# Patient Record
Sex: Female | Born: 1980 | Race: White | Hispanic: No | Marital: Married | Smoking: Never smoker
Health system: Southern US, Community
[De-identification: ages and names within clinical notes are randomized; demographics above are authoritative.]

## PROBLEM LIST (undated history)

## (undated) ENCOUNTER — Inpatient Hospital Stay (HOSPITAL_COMMUNITY): Payer: Self-pay

## (undated) DIAGNOSIS — D6859 Other primary thrombophilia: Secondary | ICD-10-CM

## (undated) DIAGNOSIS — E88819 Insulin resistance, unspecified: Secondary | ICD-10-CM

## (undated) DIAGNOSIS — D6959 Other secondary thrombocytopenia: Secondary | ICD-10-CM

## (undated) DIAGNOSIS — T7840XA Allergy, unspecified, initial encounter: Secondary | ICD-10-CM

## (undated) DIAGNOSIS — K5900 Constipation, unspecified: Secondary | ICD-10-CM

## (undated) DIAGNOSIS — D689 Coagulation defect, unspecified: Secondary | ICD-10-CM

## (undated) DIAGNOSIS — K589 Irritable bowel syndrome without diarrhea: Secondary | ICD-10-CM

## (undated) DIAGNOSIS — E282 Polycystic ovarian syndrome: Secondary | ICD-10-CM

## (undated) DIAGNOSIS — B019 Varicella without complication: Secondary | ICD-10-CM

## (undated) DIAGNOSIS — R030 Elevated blood-pressure reading, without diagnosis of hypertension: Secondary | ICD-10-CM

## (undated) DIAGNOSIS — O99119 Other diseases of the blood and blood-forming organs and certain disorders involving the immune mechanism complicating pregnancy, unspecified trimester: Secondary | ICD-10-CM

## (undated) DIAGNOSIS — D6852 Prothrombin gene mutation: Secondary | ICD-10-CM

## (undated) DIAGNOSIS — E8881 Metabolic syndrome: Secondary | ICD-10-CM

## (undated) DIAGNOSIS — O139 Gestational [pregnancy-induced] hypertension without significant proteinuria, unspecified trimester: Secondary | ICD-10-CM

## (undated) DIAGNOSIS — D759 Disease of blood and blood-forming organs, unspecified: Secondary | ICD-10-CM

## (undated) HISTORY — DX: Irritable bowel syndrome, unspecified: K58.9

## (undated) HISTORY — DX: Varicella without complication: B01.9

## (undated) HISTORY — DX: Allergy, unspecified, initial encounter: T78.40XA

## (undated) HISTORY — PX: OTHER SURGICAL HISTORY: SHX169

## (undated) HISTORY — DX: Constipation, unspecified: K59.00

## (undated) HISTORY — DX: Prothrombin gene mutation: D68.52

## (undated) HISTORY — DX: Coagulation defect, unspecified: D68.9

---

## 2003-09-26 ENCOUNTER — Other Ambulatory Visit: Admission: RE | Admit: 2003-09-26 | Discharge: 2003-09-26 | Payer: Self-pay | Admitting: Family Medicine

## 2009-08-10 ENCOUNTER — Encounter: Payer: Self-pay | Admitting: Family Medicine

## 2009-08-10 ENCOUNTER — Ambulatory Visit (HOSPITAL_COMMUNITY): Admission: RE | Admit: 2009-08-10 | Discharge: 2009-08-10 | Payer: Self-pay | Admitting: Family Medicine

## 2009-08-10 ENCOUNTER — Ambulatory Visit: Payer: Self-pay | Admitting: Vascular Surgery

## 2011-01-28 ENCOUNTER — Encounter (HOSPITAL_COMMUNITY): Payer: Self-pay

## 2011-01-28 ENCOUNTER — Ambulatory Visit (HOSPITAL_COMMUNITY)
Admission: RE | Admit: 2011-01-28 | Discharge: 2011-01-28 | Disposition: A | Discharge status: Home / Self Care | Payer: BC Managed Care – PPO | Source: Ambulatory Visit | Attending: Obstetrics and Gynecology | Admitting: Obstetrics and Gynecology

## 2011-01-28 DIAGNOSIS — O30049 Twin pregnancy, dichorionic/diamniotic, unspecified trimester: Secondary | ICD-10-CM

## 2011-01-28 NOTE — Progress Notes (Signed)
MFM CONSULT  30 y/o G1 at 10 weeks 6 days with dichorionic twin gestation conceived after IVF.  Patient has a history that has been attributed to her infertility history and is currently taking Metformin 1000mg  BID because of this.   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  These were diagnosed and confirmed by Dr. Mikel Cella with [facility] hematology due to a family history of patient's brother having a childhood stroke.  Patient herself does not have a history of any thromboembolic events.  She is currently treated with Lovenox 40 mg QD for thromboprophylaxsis.     She presents today for recommendations regarding pregnancy management.  These were discussed at length and are outlined below:  1. Recommend prophylactic anticoagulation for the duration of pregnancy and through 6 weeks post partum.  Her current dose of Lovenox is an acceptable regimen.  Recommend increasing this to 40mg  BID at 28 weeks due to patient's underlying pathologies, increased BMI, and twin gestation.  Patient is currently having some issues with her insurance and requests unfractionated heparin for the next several weeks due to the cost association with Lovenox.  A prescription was provided today for Heparin 7500 units BID SQ and teaching was provided regarding use of this.  The risks and benefits of Lovenox versus Heparin were reviewed.  Platelet count should be evaluated within 1 week of changing to unfractionated heparin.    2.   Close surveillance for the development of preterm labor or cervical shortening.  Measurement of cervical length at several points through the mid second to mid third trimester may be of benefit in determining increased risks for premature delivery and allowing interventions such as decreased activity, administration of betamethasone, and intensified surveillance.  3.  Serial evaluations of fetal growth and amniotic fluid volume at approximately 4 week  intervals through the third trimester.  Antenatal testing starting at 32 weeks may be of benefit.  4.  If maternal and fetal status remain reassuring, delivery is recommended at [redacted] weeks gestation.  5.  Given a diagnosis of PCOS, increased BMI, and twin gestation, early screening for gestational diabetes is recommended.  If this is normal, repeat evaluation at 26-25 weeks is recommended.    6.  Close surveillance for the development of preeclampsia will also be necessary.  The signs and symptoms of this were reviewed today.  First trimester screening for fetal aneuploidy was also discussed today.  Patient declined this.  Recommend ultrasound at 18 weeks for evaluation of fetal anatomy.    Thanks for consult.  We would be pleased to perform any of this testing or to consult again at any point in gestation.  If desired, please call to schedule.   40 min

## 2011-02-08 LAB — HEPATITIS B SURFACE ANTIGEN: Hepatitis B Surface Ag: NEGATIVE

## 2011-02-08 LAB — ABO/RH: RH Type: POSITIVE

## 2011-02-08 LAB — RPR: RPR: NONREACTIVE

## 2011-04-19 DIAGNOSIS — O139 Gestational [pregnancy-induced] hypertension without significant proteinuria, unspecified trimester: Secondary | ICD-10-CM

## 2011-04-19 HISTORY — DX: Gestational (pregnancy-induced) hypertension without significant proteinuria, unspecified trimester: O13.9

## 2011-07-19 ENCOUNTER — Encounter (HOSPITAL_COMMUNITY): Payer: Self-pay

## 2011-07-20 ENCOUNTER — Other Ambulatory Visit: Payer: Self-pay | Admitting: Obstetrics & Gynecology

## 2011-08-01 ENCOUNTER — Encounter (HOSPITAL_COMMUNITY)
Admission: RE | Admit: 2011-08-01 | Discharge: 2011-08-01 | Disposition: A | Discharge status: Home / Self Care | Payer: BC Managed Care – PPO | Source: Ambulatory Visit | Attending: Obstetrics & Gynecology | Admitting: Obstetrics & Gynecology

## 2011-08-01 ENCOUNTER — Encounter (HOSPITAL_COMMUNITY): Payer: Self-pay

## 2011-08-01 HISTORY — DX: Metabolic syndrome: E88.81

## 2011-08-01 HISTORY — DX: Disease of blood and blood-forming organs, unspecified: D75.9

## 2011-08-01 HISTORY — DX: Insulin resistance, unspecified: E88.819

## 2011-08-01 HISTORY — DX: Gestational (pregnancy-induced) hypertension without significant proteinuria, unspecified trimester: O13.9

## 2011-08-01 LAB — CBC
MCH: 28.6 pg (ref 26.0–34.0)
MCHC: 33.6 g/dL (ref 30.0–36.0)
MCV: 85.1 fL (ref 78.0–100.0)
Platelets: 162 10*3/uL (ref 150–400)
RBC: 4.62 MIL/uL (ref 3.87–5.11)

## 2011-08-01 LAB — SURGICAL PCR SCREEN: MRSA, PCR: NEGATIVE

## 2011-08-01 NOTE — Patient Instructions (Addendum)
YOUR PROCEDURE IS SCHEDULED ON:08/04/11  ENTER THROUGH THE MAIN ENTRANCE OF [facility] AT:1145 am  USE DESK PHONE AND DIAL [phone] TO INFORM us OF YOUR ARRIVAL  CALL [phone] IF YOU HAVE ANY QUESTIONS OR PROBLEMS PRIOR TO YOUR ARRIVAL.  REMEMBER: DO NOT EAT AFTER MIDNIGHT : Wed  SPECIAL INSTRUCTIONS:clear liquids ok until 9am- hold Metformin for 24 hrs prior to surgery   YOU MAY BRUSH YOUR TEETH THE MORNING OF SURGERY   TAKE THESE MEDICINES THE DAY OF SURGERY WITH SIP OF WATER: none   DO NOT WEAR JEWELRY, EYE MAKEUP, LIPSTICK OR DARK FINGERNAIL POLISH DO NOT WEAR LOTIONS  DO NOT SHAVE FOR 48 HOURS PRIOR TO SURGERY  YOU WILL NOT BE ALLOWED TO DRIVE YOURSELF HOME.  NAME OF DRIVER: Apolinar Junes

## 2011-08-02 LAB — RPR: RPR Ser Ql: NONREACTIVE

## 2011-08-03 MED ORDER — CEFAZOLIN SODIUM-DEXTROSE 2-3 GM-% IV SOLR
2.0000 g | INTRAVENOUS | Status: AC
  Administered 2011-08-04: 2 g via INTRAVENOUS
  Filled 2011-08-03: qty 50

## 2011-08-04 ENCOUNTER — Encounter (HOSPITAL_COMMUNITY)
Admission: RE | Disposition: A | Discharge status: Home / Self Care | Payer: Self-pay | Source: Ambulatory Visit | Attending: Obstetrics & Gynecology

## 2011-08-04 ENCOUNTER — Encounter (HOSPITAL_COMMUNITY): Payer: Self-pay | Admitting: *Deleted

## 2011-08-04 ENCOUNTER — Encounter (HOSPITAL_COMMUNITY): Payer: Self-pay | Admitting: Anesthesiology

## 2011-08-04 ENCOUNTER — Inpatient Hospital Stay (HOSPITAL_COMMUNITY): Payer: BC Managed Care – PPO | Admitting: Anesthesiology

## 2011-08-04 ENCOUNTER — Inpatient Hospital Stay (HOSPITAL_COMMUNITY)
Admission: RE | Admit: 2011-08-04 | Discharge: 2011-08-07 | DRG: 370 | Disposition: A | Discharge status: Home / Self Care | Payer: BC Managed Care – PPO | Source: Ambulatory Visit | Attending: Obstetrics & Gynecology | Admitting: Obstetrics & Gynecology

## 2011-08-04 DIAGNOSIS — O99892 Other specified diseases and conditions complicating childbirth: Secondary | ICD-10-CM | POA: Diagnosis present

## 2011-08-04 DIAGNOSIS — D6859 Other primary thrombophilia: Secondary | ICD-10-CM | POA: Diagnosis present

## 2011-08-04 DIAGNOSIS — O30049 Twin pregnancy, dichorionic/diamniotic, unspecified trimester: Secondary | ICD-10-CM | POA: Diagnosis present

## 2011-08-04 DIAGNOSIS — D689 Coagulation defect, unspecified: Secondary | ICD-10-CM | POA: Diagnosis present

## 2011-08-04 DIAGNOSIS — O30009 Twin pregnancy, unspecified number of placenta and unspecified number of amniotic sacs, unspecified trimester: Principal | ICD-10-CM | POA: Diagnosis present

## 2011-08-04 DIAGNOSIS — O139 Gestational [pregnancy-induced] hypertension without significant proteinuria, unspecified trimester: Secondary | ICD-10-CM | POA: Diagnosis present

## 2011-08-04 DIAGNOSIS — R030 Elevated blood-pressure reading, without diagnosis of hypertension: Secondary | ICD-10-CM | POA: Diagnosis present

## 2011-08-04 DIAGNOSIS — L259 Unspecified contact dermatitis, unspecified cause: Secondary | ICD-10-CM | POA: Diagnosis not present

## 2011-08-04 DIAGNOSIS — Z22330 Carrier of Group B streptococcus: Secondary | ICD-10-CM

## 2011-08-04 DIAGNOSIS — D6959 Other secondary thrombocytopenia: Secondary | ICD-10-CM | POA: Diagnosis not present

## 2011-08-04 DIAGNOSIS — O9912 Other diseases of the blood and blood-forming organs and certain disorders involving the immune mechanism complicating childbirth: Secondary | ICD-10-CM | POA: Diagnosis present

## 2011-08-04 HISTORY — DX: Elevated blood-pressure reading, without diagnosis of hypertension: R03.0

## 2011-08-04 HISTORY — DX: Other primary thrombophilia: D68.59

## 2011-08-04 HISTORY — DX: Other diseases of the blood and blood-forming organs and certain disorders involving the immune mechanism complicating pregnancy, unspecified trimester: O99.119

## 2011-08-04 HISTORY — DX: Other secondary thrombocytopenia: D69.59

## 2011-08-04 LAB — CBC
HCT: 38.5 % (ref 36.0–46.0)
Hemoglobin: 12.6 g/dL (ref 12.0–15.0)
MCH: 28.4 pg (ref 26.0–34.0)
MCHC: 32.7 g/dL (ref 30.0–36.0)
MCV: 86.9 fL (ref 78.0–100.0)
Platelets: 155 K/uL (ref 150–400)
RBC: 4.43 MIL/uL (ref 3.87–5.11)
RDW: 14.8 % (ref 11.5–15.5)
WBC: 10.1 K/uL (ref 4.0–10.5)

## 2011-08-04 LAB — ABO/RH: ABO/RH(D): O POS

## 2011-08-04 LAB — TYPE AND SCREEN: ABO/RH(D): O POS

## 2011-08-04 SURGERY — Surgical Case
Anesthesia: Epidural | Site: Abdomen | Wound class: Clean Contaminated

## 2011-08-04 MED ORDER — KETOROLAC TROMETHAMINE 60 MG/2ML IM SOLN
INTRAMUSCULAR | Status: AC
  Administered 2011-08-04: 60 mg via INTRAMUSCULAR
  Filled 2011-08-04: qty 2

## 2011-08-04 MED ORDER — OXYCODONE-ACETAMINOPHEN 5-325 MG PO TABS
1.0000 | ORAL_TABLET | ORAL | Status: DC | PRN
  Administered 2011-08-05 (×2): 2 via ORAL
  Administered 2011-08-05 – 2011-08-06 (×3): 1 via ORAL
  Administered 2011-08-06: 2 via ORAL
  Administered 2011-08-06 (×3): 1 via ORAL
  Administered 2011-08-06: 2 via ORAL
  Administered 2011-08-07: 1 via ORAL
  Administered 2011-08-07: 2 via ORAL
  Administered 2011-08-07 (×2): 1 via ORAL
  Filled 2011-08-04: qty 2
  Filled 2011-08-04 (×9): qty 1
  Filled 2011-08-04 (×2): qty 2
  Filled 2011-08-04 (×3): qty 1
  Filled 2011-08-04: qty 2

## 2011-08-04 MED ORDER — OXYTOCIN 20 UNITS IN LACTATED RINGERS INFUSION - SIMPLE
INTRAVENOUS | Status: AC
  Administered 2011-08-04: 20 [IU] via INTRAVENOUS
  Filled 2011-08-04: qty 1000

## 2011-08-04 MED ORDER — ONDANSETRON HCL 4 MG PO TABS: 4.0000 mg | ORAL_TABLET | ORAL | Status: DC | PRN

## 2011-08-04 MED ORDER — MEPERIDINE HCL 25 MG/ML IJ SOLN: 6.2500 mg | INTRAMUSCULAR | Status: DC | PRN

## 2011-08-04 MED ORDER — LACTATED RINGERS IV SOLN
INTRAVENOUS | Status: DC
  Administered 2011-08-04 (×3): via INTRAVENOUS

## 2011-08-04 MED ORDER — NALBUPHINE HCL 10 MG/ML IJ SOLN: 5.0000 mg | INTRAMUSCULAR | Status: DC | PRN

## 2011-08-04 MED ORDER — SCOPOLAMINE 1 MG/3DAYS TD PT72
1.0000 | MEDICATED_PATCH | Freq: Once | TRANSDERMAL | Status: DC
  Administered 2011-08-04: 1.5 mg via TRANSDERMAL

## 2011-08-04 MED ORDER — OXYTOCIN 20 UNITS IN LACTATED RINGERS INFUSION - SIMPLE
INTRAVENOUS | Status: DC | PRN
  Administered 2011-08-04: 20 [IU] via INTRAVENOUS

## 2011-08-04 MED ORDER — IBUPROFEN 600 MG PO TABS: 600.0000 mg | ORAL_TABLET | Freq: Four times a day (QID) | ORAL | Status: DC | PRN

## 2011-08-04 MED ORDER — PHENYLEPHRINE 40 MCG/ML (10ML) SYRINGE FOR IV PUSH (FOR BLOOD PRESSURE SUPPORT)
PREFILLED_SYRINGE | INTRAVENOUS | Status: AC
  Filled 2011-08-04: qty 5

## 2011-08-04 MED ORDER — DIPHENHYDRAMINE HCL 25 MG PO CAPS
25.0000 mg | ORAL_CAPSULE | ORAL | Status: DC | PRN
  Administered 2011-08-05: 25 mg via ORAL
  Filled 2011-08-04: qty 1

## 2011-08-04 MED ORDER — ONDANSETRON HCL 4 MG/2ML IJ SOLN
INTRAMUSCULAR | Status: DC | PRN
  Administered 2011-08-04: 4 mg via INTRAVENOUS

## 2011-08-04 MED ORDER — OXYTOCIN 10 UNIT/ML IJ SOLN
INTRAMUSCULAR | Status: AC
  Filled 2011-08-04: qty 4

## 2011-08-04 MED ORDER — WITCH HAZEL-GLYCERIN EX PADS: 1.0000 "application " | MEDICATED_PAD | CUTANEOUS | Status: DC | PRN

## 2011-08-04 MED ORDER — SODIUM CHLORIDE 0.9 % IV SOLN: 1.0000 ug/kg/h | INTRAVENOUS | Status: DC | PRN

## 2011-08-04 MED ORDER — OXYTOCIN 20 UNITS IN LACTATED RINGERS INFUSION - SIMPLE: 125.0000 mL/h | INTRAVENOUS | Status: AC

## 2011-08-04 MED ORDER — HEPARIN SODIUM (PORCINE) 10000 UNIT/ML IJ SOLN
5000.0000 [IU] | Freq: Two times a day (BID) | INTRAMUSCULAR | Status: DC
  Administered 2011-08-06 (×2): 5000 [IU] via SUBCUTANEOUS
  Filled 2011-08-04 (×4): qty 0.5

## 2011-08-04 MED ORDER — FENTANYL CITRATE 0.05 MG/ML IJ SOLN
INTRAMUSCULAR | Status: AC
  Administered 2011-08-04: 50 ug via INTRAVENOUS
  Filled 2011-08-04: qty 2

## 2011-08-04 MED ORDER — FENTANYL CITRATE 0.05 MG/ML IJ SOLN
INTRAMUSCULAR | Status: AC
  Filled 2011-08-04: qty 2

## 2011-08-04 MED ORDER — MENTHOL 3 MG MT LOZG: 1.0000 | LOZENGE | OROMUCOSAL | Status: DC | PRN

## 2011-08-04 MED ORDER — KETOROLAC TROMETHAMINE 30 MG/ML IJ SOLN
30.0000 mg | Freq: Four times a day (QID) | INTRAMUSCULAR | Status: AC | PRN
  Filled 2011-08-04: qty 1

## 2011-08-04 MED ORDER — ONDANSETRON HCL 4 MG/2ML IJ SOLN
INTRAMUSCULAR | Status: AC
  Filled 2011-08-04: qty 2

## 2011-08-04 MED ORDER — ONDANSETRON HCL 4 MG/2ML IJ SOLN: 4.0000 mg | INTRAMUSCULAR | Status: DC | PRN

## 2011-08-04 MED ORDER — NALOXONE HCL 0.4 MG/ML IJ SOLN: 0.4000 mg | INTRAMUSCULAR | Status: DC | PRN

## 2011-08-04 MED ORDER — LACTATED RINGERS IV SOLN
INTRAVENOUS | Status: DC
  Administered 2011-08-04: 23:00:00 via INTRAVENOUS

## 2011-08-04 MED ORDER — DOCUSATE SODIUM 100 MG PO CAPS
100.0000 mg | ORAL_CAPSULE | Freq: Two times a day (BID) | ORAL | Status: DC
  Administered 2011-08-04 – 2011-08-07 (×4): 100 mg via ORAL
  Filled 2011-08-04 (×4): qty 1

## 2011-08-04 MED ORDER — DIBUCAINE 1 % RE OINT: 1.0000 "application " | TOPICAL_OINTMENT | RECTAL | Status: DC | PRN

## 2011-08-04 MED ORDER — SENNOSIDES-DOCUSATE SODIUM 8.6-50 MG PO TABS
2.0000 | ORAL_TABLET | Freq: Every day | ORAL | Status: DC
  Administered 2011-08-04 – 2011-08-06 (×3): 2 via ORAL

## 2011-08-04 MED ORDER — KETOROLAC TROMETHAMINE 30 MG/ML IJ SOLN: 30.0000 mg | Freq: Four times a day (QID) | INTRAMUSCULAR | Status: AC | PRN

## 2011-08-04 MED ORDER — SIMETHICONE 80 MG PO CHEW: 80.0000 mg | CHEWABLE_TABLET | ORAL | Status: DC | PRN

## 2011-08-04 MED ORDER — METOCLOPRAMIDE HCL 5 MG/ML IJ SOLN: 10.0000 mg | Freq: Three times a day (TID) | INTRAMUSCULAR | Status: DC | PRN

## 2011-08-04 MED ORDER — TETANUS-DIPHTH-ACELL PERTUSSIS 5-2.5-18.5 LF-MCG/0.5 IM SUSP: 0.5000 mL | Freq: Once | INTRAMUSCULAR | Status: DC

## 2011-08-04 MED ORDER — DIPHENHYDRAMINE HCL 25 MG PO CAPS: 25.0000 mg | ORAL_CAPSULE | Freq: Four times a day (QID) | ORAL | Status: DC | PRN

## 2011-08-04 MED ORDER — MORPHINE SULFATE (PF) 0.5 MG/ML IJ SOLN
INTRAMUSCULAR | Status: DC | PRN
  Administered 2011-08-04: .1 mg via INTRATHECAL

## 2011-08-04 MED ORDER — FENTANYL CITRATE 0.05 MG/ML IJ SOLN
25.0000 ug | INTRAMUSCULAR | Status: DC | PRN
  Administered 2011-08-04: 50 ug via INTRAVENOUS

## 2011-08-04 MED ORDER — PRENATAL MULTIVITAMIN CH
1.0000 | ORAL_TABLET | Freq: Every day | ORAL | Status: DC
  Administered 2011-08-05 – 2011-08-06 (×2): 1 via ORAL
  Filled 2011-08-04 (×2): qty 1

## 2011-08-04 MED ORDER — DIPHENHYDRAMINE HCL 50 MG/ML IJ SOLN: 25.0000 mg | INTRAMUSCULAR | Status: DC | PRN

## 2011-08-04 MED ORDER — DIPHENHYDRAMINE HCL 50 MG/ML IJ SOLN
12.5000 mg | INTRAMUSCULAR | Status: DC | PRN
Start: 2011-08-04 — End: 2011-08-07

## 2011-08-04 MED ORDER — KETOROLAC TROMETHAMINE 60 MG/2ML IM SOLN
60.0000 mg | Freq: Once | INTRAMUSCULAR | Status: AC | PRN
  Administered 2011-08-04: 60 mg via INTRAMUSCULAR

## 2011-08-04 MED ORDER — SCOPOLAMINE 1 MG/3DAYS TD PT72
MEDICATED_PATCH | TRANSDERMAL | Status: AC
  Administered 2011-08-04: 1.5 mg via TRANSDERMAL
  Filled 2011-08-04: qty 1

## 2011-08-04 MED ORDER — SODIUM CHLORIDE 0.9 % IJ SOLN: 3.0000 mL | INTRAMUSCULAR | Status: DC | PRN

## 2011-08-04 MED ORDER — MORPHINE SULFATE 0.5 MG/ML IJ SOLN
INTRAMUSCULAR | Status: AC
  Filled 2011-08-04: qty 10

## 2011-08-04 MED ORDER — SCOPOLAMINE 1 MG/3DAYS TD PT72: 1.0000 | MEDICATED_PATCH | Freq: Once | TRANSDERMAL | Status: DC

## 2011-08-04 MED ORDER — BUPIVACAINE IN DEXTROSE 0.75-8.25 % IT SOLN
INTRATHECAL | Status: DC | PRN
  Administered 2011-08-04: 1.4 mg via INTRATHECAL

## 2011-08-04 MED ORDER — LANOLIN HYDROUS EX OINT: 1.0000 "application " | TOPICAL_OINTMENT | CUTANEOUS | Status: DC | PRN

## 2011-08-04 MED ORDER — SIMETHICONE 80 MG PO CHEW
80.0000 mg | CHEWABLE_TABLET | Freq: Three times a day (TID) | ORAL | Status: DC
  Administered 2011-08-04 – 2011-08-07 (×9): 80 mg via ORAL

## 2011-08-04 MED ORDER — FENTANYL CITRATE 0.05 MG/ML IJ SOLN
INTRAMUSCULAR | Status: DC | PRN
  Administered 2011-08-04: 12.5 ug via INTRATHECAL

## 2011-08-04 MED ORDER — PHENYLEPHRINE HCL 10 MG/ML IJ SOLN
INTRAMUSCULAR | Status: DC | PRN
  Administered 2011-08-04: 80 ug via INTRAVENOUS
  Administered 2011-08-04 (×5): 40 ug via INTRAVENOUS

## 2011-08-04 MED ORDER — ONDANSETRON HCL 4 MG/2ML IJ SOLN: 4.0000 mg | Freq: Three times a day (TID) | INTRAMUSCULAR | Status: DC | PRN

## 2011-08-04 SURGICAL SUPPLY — 41 items
APL SKNCLS STERI-STRIP NONHPOA (GAUZE/BANDAGES/DRESSINGS)
BENZOIN TINCTURE PRP APPL 2/3 (GAUZE/BANDAGES/DRESSINGS) IMPLANT
CHLORAPREP W/TINT 26ML (MISCELLANEOUS) ×2 IMPLANT
CLOTH BEACON ORANGE TIMEOUT ST (SAFETY) ×2 IMPLANT
CONTAINER PREFILL 10% NBF 15ML (MISCELLANEOUS) IMPLANT
DRESSING TELFA 8X3 (GAUZE/BANDAGES/DRESSINGS) ×2 IMPLANT
ELECT REM PT RETURN 9FT ADLT (ELECTROSURGICAL) ×2
ELECTRODE REM PT RTRN 9FT ADLT (ELECTROSURGICAL) ×1 IMPLANT
EXTRACTOR VACUUM KIWI (MISCELLANEOUS) IMPLANT
EXTRACTOR VACUUM M CUP 4 TUBE (SUCTIONS) IMPLANT
GAUZE SPONGE 4X4 12PLY STRL LF (GAUZE/BANDAGES/DRESSINGS) ×4 IMPLANT
GLOVE BIO SURGEON STRL SZ7 (GLOVE) ×4 IMPLANT
GLOVE BIOGEL PI IND STRL 7.0 (GLOVE) ×3 IMPLANT
GLOVE BIOGEL PI INDICATOR 7.0 (GLOVE) ×3
GOWN PREVENTION PLUS LG XLONG (DISPOSABLE) ×6 IMPLANT
KIT ABG SYR 3ML LUER SLIP (SYRINGE) IMPLANT
NEEDLE HYPO 25X5/8 SAFETYGLIDE (NEEDLE) IMPLANT
NS IRRIG 1000ML POUR BTL (IV SOLUTION) ×2 IMPLANT
PACK C SECTION WH (CUSTOM PROCEDURE TRAY) ×2 IMPLANT
PAD ABD 7.5X8 STRL (GAUZE/BANDAGES/DRESSINGS) ×2 IMPLANT
RTRCTR C-SECT PINK 25CM LRG (MISCELLANEOUS) IMPLANT
SLEEVE SCD COMPRESS KNEE MED (MISCELLANEOUS) ×2 IMPLANT
SPONGE GAUZE 4X4 12PLY (GAUZE/BANDAGES/DRESSINGS) ×2 IMPLANT
STAPLER VISISTAT 35W (STAPLE) ×2 IMPLANT
STRIP CLOSURE SKIN 1/4X4 (GAUZE/BANDAGES/DRESSINGS) IMPLANT
SUT MNCRL 0 VIOLET CTX 36 (SUTURE) ×3 IMPLANT
SUT MONOCRYL 0 CTX 36 (SUTURE) ×3
SUT PLAIN 0 NONE (SUTURE) IMPLANT
SUT PLAIN 2 0 (SUTURE)
SUT PLAIN 2 0 XLH (SUTURE) ×2 IMPLANT
SUT PLAIN ABS 2-0 CT1 27XMFL (SUTURE) IMPLANT
SUT VIC AB 0 CT1 27 (SUTURE) ×4
SUT VIC AB 0 CT1 27XBRD ANBCTR (SUTURE) ×2 IMPLANT
SUT VIC AB 2-0 CT1 27 (SUTURE) ×4
SUT VIC AB 2-0 CT1 TAPERPNT 27 (SUTURE) ×2 IMPLANT
SUT VIC AB 4-0 KS 27 (SUTURE) IMPLANT
SUT VICRYL 0 TIES 12 18 (SUTURE) IMPLANT
TAPE CLOTH SURG 4X10 WHT LF (GAUZE/BANDAGES/DRESSINGS) ×2 IMPLANT
TOWEL OR 17X24 6PK STRL BLUE (TOWEL DISPOSABLE) ×4 IMPLANT
TRAY FOLEY CATH 14FR (SET/KITS/TRAYS/PACK) IMPLANT
WATER STERILE IRR 1000ML POUR (IV SOLUTION) ×2 IMPLANT

## 2011-08-04 NOTE — Consult Note (Signed)
Neonatology Note:   Attendance at C-section:    I was asked to attend this primary C/S at 37 6/7  weeks due to twin gestation. The mother is a G1P0 O pos, GBS pos with PIH, not requiring medication, and glucose intolerance, on Metformin.  She also had Protein S deficiency, on Lovenox, then Heparin. The twin pregnancy was the result of IVF. ROM at delivery, fluid clear.  Twin A was delivered vertex and was vigorous with good spontaneous cry and tone. Needed only minimal bulb suctioning. Ap 8/9. He appears larger and paler than his twin.  Lungs clear to ausc in DR. To CN to care of Pediatrician.  Twin B had hydronephrosis seen on prenatal ultrasound which had resolved by the time of delivery, per OB note. He was in the vertex position, but delivered breech. He was also vigorous with good tone and needed no resuscitation. Ap 9/9.  He appears smaller and ruddier than his twin Lungs clear to auscultation in the DR. To CN to care of Pediatrician.   Deatra James, MD

## 2011-08-04 NOTE — Anesthesia Postprocedure Evaluation (Signed)
Anesthesia Post Note  Patient: Leah Moreno  Procedure(s) Performed: Procedure(s) (LRB): CESAREAN SECTION (N/A)  Anesthesia type: Spinal  Patient location: PACU  Post pain: Pain level controlled  Post assessment: Post-op Vital signs reviewed  Last Vitals:  Filed Vitals:   08/04/11 1530  BP: 128/81  Pulse: 80  Temp:   Resp: 20    Post vital signs: Reviewed  Level of consciousness: awake  Complications: No apparent anesthesia complications

## 2011-08-04 NOTE — Transfer of Care (Signed)
Immediate Anesthesia Transfer of Care Note  Patient: Leah Moreno  Procedure(s) Performed: Procedure(s) (LRB): CESAREAN SECTION (N/A)  Patient Location: PACU  Anesthesia Type: Spinal  Level of Consciousness: awake, alert  and oriented  Airway & Oxygen Therapy: Patient Spontanous Breathing  Post-op Assessment: Report given to PACU RN and Post -op Vital signs reviewed and stable  Post vital signs: stable  Complications: No apparent anesthesia complications

## 2011-08-04 NOTE — Op Note (Signed)
Cesarean Section Procedure Note: Primary Low Transverse Cesarean section  Leah Moreno 08/04/2011   Indications: Di-Di twins, elective cesarean desired, 37.6/7 wks   Pre-operative Diagnosis: Twins, 37.6/7 wks.   Post-operative Diagnosis: Same   Surgeon: Surgeon(s) and Role: Robley Fries, MD Assistants: Arlan Organ, CNM  Anesthesia: spinal   Procedure Details:  The patient was seen in the Holding Room. The risks, benefits, complications, treatment options, and expected outcomes were discussed with the patient. The patient concurred with the proposed plan, giving informed consent. identified as [name] and the procedure verified as C-Section Delivery.  A Time Out was held and the above information confirmed. 2 gm Ancef given. After induction of Spina anesthesia, and foley placement, the patient was draped and prepped in the usual sterile manner. A Pfannenstiel incision was made and carried down through the subcutaneous tissue to the fascia. Fascial incision was made and extended transversely. The fascia was separated from the underlying rectus tissue superiorly and inferiorly. The peritoneum was identified and entered. Peritoneal incision was extended longitudinally. The utero-vesical peritoneal reflection was incised transversely and the bladder flap was bluntly freed from the lower uterine segment.  A low transverse uterine incision was made. Delivered from CEPHALIC presentation was a Baby A at 1421 hrs. Clear amniotic fluid noted and not cord issues. Apgar scores 8 and 9 at 1 and 5 min. Cord clamped, cut, baby handed to NICU team. Amniotomy of twin B done. Baby was noted in transverse back up position. Complete BREECH extraction of twin B done with delivery time of 1422 hrs. Cord clamped and cut and baby handed to NICU team. Apgars score of 9 and 9 at 1 and 5 min. Cord ph was not sent . Cord blood sent to path from both. Both placentas was removed Intact and appeared normal. The  uterine outline, tubes and ovaries appeared normal}. The uterine incision was closed with running locked sutures of 0Vicryl followed by an imbricating layer. Hemostasis was observed. Lavage was carried out until clear. Peritoneum closed using 2-0 Vicryl.  The fascia was then reapproximated with running sutures of 0Vicryl. The subcuticular closure was performed using 2-0plain gut. The skin was closed with staples. Sterile pressure dressing applied.  Instrument, sponge, and needle counts were correct prior the abdominal closure and were correct at the conclusion of the case.   Findings: A -Boy, delivered cephalic at 1421 hrs, Apgars 8/9 and wt 6 lb 2 oz. B- Boy delivered as breech at 1422 hrs, Apgars 9/9 and wt 5 lb 2 oz. Placentas and cords normal.    Estimated Blood Loss: 800 cc  Total IV Fluids: 2700 ml   Urine Output: 200 cc clear in foley  Specimens: Cord blood and placenta of A and B   Complications: no complications  Disposition: PACU - hemodynamically stable.   Maternal Condition: stable   Baby condition / location:  nursery-stable  Attending Attestation: I performed the procedure.   Signed: Surgeon(s): Robley Fries, MD

## 2011-08-04 NOTE — Anesthesia Preprocedure Evaluation (Addendum)
Anesthesia Evaluation  Patient identified by MRN, date of birth, ID band Patient awake    Reviewed: Allergy & Precautions, H&P , NPO status , Patient's Chart, lab work & pertinent test results, reviewed documented beta blocker date and time   History of Anesthesia Complications Negative for: history of anesthetic complications  Airway Mallampati: I TM Distance: >3 FB Neck ROM: full    Dental  (+) Teeth Intact   Pulmonary neg pulmonary ROS,  breath sounds clear to auscultation        Cardiovascular hypertension (PIH), Rhythm:regular Rate:Normal     Neuro/Psych negative neurological ROS  negative psych ROS   GI/Hepatic Neg liver ROS, GERD-  Medicated,  Endo/Other  Morbid obesityPCOS - on metformin  Renal/GU negative Renal ROS  negative genitourinary   Musculoskeletal   Abdominal   Peds  Hematology negative hematology ROS (+) Blood dyscrasia (protein S deficiency, prothrombin gene factor II deficiency has been on lovenox and heparin.  Has been off lovenox for more than 10 days.  Took 7500 units heparin last at 10:45 pm last night), ,   Anesthesia Other Findings Naproxen caused stomach problems, takes aspirin and ibuprofen regularly without problems  Reproductive/Obstetrics (+) Pregnancy (twins)                          Anesthesia Physical Anesthesia Plan  ASA: III  Anesthesia Plan: Spinal   Post-op Pain Management:    Induction:   Airway Management Planned:   Additional Equipment:   Intra-op Plan:   Post-operative Plan:   Informed Consent: I have reviewed the patients History and Physical, chart, labs and discussed the procedure including the risks, benefits and alternatives for the proposed anesthesia with the patient or authorized representative who has indicated his/her understanding and acceptance.   Dental Advisory Given  Plan Discussed with: CRNA and Surgeon  Anesthesia Plan  Comments:        Anesthesia Quick Evaluation

## 2011-08-04 NOTE — H&P (Addendum)
Leah Moreno is a 31 y.o. female presenting for elective cesarean section for Twins at 37.6/7 wks. Feels well. Carpel tunnel and borderline BPs, no PEC. .  IVF pregnancy. Pt w PCOS, on metformin, Protein S deficiency on Lovenox and now on Heparin, last dose >12 hrs back.  [facility]are at [facility]. No complications, good interval growth for both babies, BPP 8/8 every wk. Twin B renal hydronephrosis- resolved at 37 wks.   Maternal Medical History:  Contractions: Frequency: rare.    Fetal activity: Perceived fetal activity is normal.    Prenatal complications: No bleeding, IUGR or pre-eclampsia.   Prenatal Complications - Diabetes: none.    OB History    Grav Para Term Preterm Abortions TAB SAB Ect Mult Living   1         0     Past Medical History  Diagnosis Date  . Blood dyscrasia     Protein S deficient, prothrombing gene factor 2  . Insulin resistance     takes Metformin- has PCOS  . Heartburn in pregnancy   . Pregnancy induced hypertension 2013   Past Surgical History  Procedure Date  . Ivf     egg retrieval   Family History: family history is not on file. Social History:  reports that she has never smoked. She does not have any smokeless tobacco history on file. She reports that she does not drink alcohol or use illicit drugs.  ROS as above    Blood pressure 131/87, pulse 102, temperature 98.4 F (36.9 C), temperature source Oral, resp. rate 18, last menstrual period 11/12/2010, SpO2 99.00%. Exam Physical Exam  Prenatal labs: ABO, Rh: O/Positive/-- (10/23 0000) Antibody: Negative (10/23 0000) Rubella: Immune (10/23 0000) RPR: NON REACTIVE (04/15 1430)  HBsAg: Negative (10/23 0000)  HIV: Non-reactive (10/23 0000)  GBS:   (+) in urine. Anatomy sono nl both, normal interval growth. Twin B right hydronephrosis, but resolved at 37 wk sono Genetic screening declined  Assessment/Plan: 31 yo G1 at 37.6/7 wks, DiDi twins. Desired elective c/section.  Well controlled  borderline HTN, no meds. No PEC.  Risks/complications of surgery reviewed incl infection, bleeding, damage to internal organs including bladder, bowels, ureters, blood vessels, other risks from anesthesia, VTE and delayed complications of any surgery, complications in future surgery reviewed. Also discussed neonatal complications incl difficult delivery, laceration, vacuum assistance, TTN etc. Pt understands and agrees, all concerns addressed.       Saharsh Sterling R 08/04/2011, 12:51 PM

## 2011-08-04 NOTE — Preoperative (Signed)
Beta Blockers   Reason not to administer Beta Blockers:Not Applicable 

## 2011-08-04 NOTE — Anesthesia Procedure Notes (Signed)
Spinal  Patient location during procedure: OR Start time: 08/04/2011 1:59 PM End time: 08/04/2011 2:05 PM Staffing Anesthesiologist: Sandrea Hughs Preanesthetic Checklist Completed: patient identified, site marked, surgical consent, pre-op evaluation, timeout performed, IV checked, risks and benefits discussed and monitors and equipment checked Spinal Block Patient position: sitting Prep: DuraPrep Patient monitoring: heart rate, cardiac monitor, continuous pulse ox and blood pressure Approach: midline Location: L3-4 Injection technique: single-shot Needle Needle type: Sprotte  Needle gauge: 24 G Needle length: 9 cm Needle insertion depth: 8 cm Assessment Sensory level: T4

## 2011-08-05 ENCOUNTER — Encounter (HOSPITAL_COMMUNITY): Payer: Self-pay

## 2011-08-05 DIAGNOSIS — D6859 Other primary thrombophilia: Secondary | ICD-10-CM

## 2011-08-05 DIAGNOSIS — R030 Elevated blood-pressure reading, without diagnosis of hypertension: Secondary | ICD-10-CM | POA: Diagnosis present

## 2011-08-05 DIAGNOSIS — D6959 Other secondary thrombocytopenia: Secondary | ICD-10-CM

## 2011-08-05 HISTORY — DX: Other secondary thrombocytopenia: D69.59

## 2011-08-05 HISTORY — DX: Elevated blood-pressure reading, without diagnosis of hypertension: R03.0

## 2011-08-05 HISTORY — DX: Other primary thrombophilia: D68.59

## 2011-08-05 LAB — CBC
Hemoglobin: 11.6 g/dL — ABNORMAL LOW (ref 12.0–15.0)
MCHC: 32.4 g/dL (ref 30.0–36.0)
RBC: 4.15 MIL/uL (ref 3.87–5.11)
WBC: 11.4 10*3/uL — ABNORMAL HIGH (ref 4.0–10.5)

## 2011-08-05 MED ORDER — HYDROCORTISONE 1 % EX CREA
TOPICAL_CREAM | Freq: Two times a day (BID) | CUTANEOUS | Status: DC
  Administered 2011-08-05 – 2011-08-07 (×3): via TOPICAL
  Filled 2011-08-05: qty 28

## 2011-08-05 NOTE — Anesthesia Postprocedure Evaluation (Signed)
  Anesthesia Post-op Note  Patient: Leah Moreno  Procedure(s) Performed: Procedure(s) (LRB): CESAREAN SECTION (N/A)  Patient Location: Mother/Baby  Anesthesia Type: Spinal  Level of Consciousness: awake, alert  and oriented  Airway and Oxygen Therapy: Patient Spontanous Breathing  Post-op Pain: none  Post-op Assessment: Post-op Vital signs reviewed  Post-op Vital Signs: Reviewed and stable  Complications: No apparent anesthesia complications

## 2011-08-05 NOTE — Addendum Note (Signed)
Addendum  created 08/05/11 0809 by Shanon Payor, CRNA   Modules edited:Notes Section

## 2011-08-05 NOTE — Progress Notes (Addendum)
Subjective: POD# 1 Information for the patient's newborn:  Javona, Bergevin [[id number]]  female Information for the patient's newborn:  Malya, Cirillo [[id number]]  female  / circ planned x 2  Reports feeling tired, but well, increased pain this am but still able to sleep. Feeding: breast Patient reports tolerating PO.  Breast symptoms: none Pain controlled with Percocet Denies HA/SOB/C/P/N/V/dizziness. Flatus absent. She reports vaginal bleeding as normal, without clots.  She is ambulating, no void yet, foley just removed this AM.     Objective:   VS:  Filed Vitals:   08/04/11 2359 08/05/11 0000 08/05/11 0204 08/05/11 0700  BP: 126/84 125/82 126/80 143/81  Pulse: 84 106 76 76  Temp:   98.4 F (36.9 C) 97.6 F (36.4 C)  TempSrc:   Oral Oral  Resp:   16 18  Weight:      SpO2:   97% 95%     Intake/Output Summary (Last 24 hours) at 08/05/11 0838 Last data filed at 08/05/11 0507  Gross per 24 hour  Intake   4500 ml  Output   3300 ml  Net   1200 ml        Basename 08/05/11 0545 08/04/11 1239  WBC 11.4* 10.1  HGB 11.6* 12.6  HCT 35.8* 38.5  PLT 123* 155     Blood type: --/--/O POS (04/18 1200)  Rubella: Immune (10/23 0000)     Physical Exam:  General: alert, cooperative, no distress and sleepy CV: Regular rate and rhythm Resp: clear Abdomen: soft, nontender, decreased bowel sounds, + macular rash in shape of OR drape on abdomen Incision: clean, dry, intact and dressing in place Uterine Fundus: firm, below umbilicus, nontender Lochia: minimal Ext: edema +1 pedal and Homans sign is negative, no sign of DVT      Assessment/Plan: 31 y.o.   POD# 1.  s/p Cesarean Delivery.  Indications: Twins                Principal Problem:  *PP care - s/p 1C/S 4/18 (twins) Active Problems:  Protein S deficiency complicating pregnancy - delivered  Borderline hypertension  Doing well, stable.    Resume anticoagulant - Heparin - at 48 hrs          Advance diet as  tolerated Hold Motrin Ambulate Routine post-op care Lactation support for twins BFing  Contact dermatitis from surgical drape adhesive  hydrocortisone 1% cream BID to affected area.   Alyssia Heese 08/05/2011, 8:38 AM

## 2011-08-06 NOTE — Progress Notes (Addendum)
Subjective: POD# 2 Information for the patient's newborn:  Leah Moreno, Cabler [[id number]]  female Information for the patient's newborn:  Leah Moreno, Wieand [[id number]]  female   / circ planned x 2  Reports feeling tired, but well, increased pain this am but still able to sleep. Feeding: breast Patient reports tolerating PO.  Breast symptoms: none Pain controlled with Percocet Denies HA/SOB/C/P/N/V/dizziness. Flatus present. She reports vaginal bleeding as normal, without clots.  She is ambulating, Voiding well.  Objective:   VS:  Filed Vitals:   08/05/11 1348 08/05/11 2102 08/05/11 2151 08/06/11 0619  BP: 129/87 158/97 136/84 137/84  Pulse: 80 84 84 81  Temp: 98.3 F (36.8 C) 98.7 F (37.1 C)  98.2 F (36.8 C)  TempSrc: Oral Oral  Oral  Resp: 20 20 18 18   Weight:      SpO2:  97% 98%       Intake/Output Summary (Last 24 hours) at 08/06/11 0640 Last data filed at 08/05/11 1340  Gross per 24 hour  Intake      0 ml  Output   1200 ml  Net  -1200 ml         Basename 08/05/11 0545 08/04/11 1239  WBC 11.4* 10.1  HGB 11.6* 12.6  HCT 35.8* 38.5  PLT 123* 155     Blood type: --/--/O POS (04/18 1200)  Rubella: Immune (10/23 0000)     Physical Exam:  General: alert, cooperative, no distress and sleepy CV: Regular rate and rhythm Resp: clear Abdomen: soft, nontender, normal bowel sounds, + gas distention mild, + macular rash in shape of OR drape on abdomen improving Incision: clean, dry, intact and staples intact, no erythema Uterine Fundus: firm, below umbilicus, nontender Lochia: minimal Ext: edema +1 pedal and Homans sign is negative, no sign of DVT      Assessment/Plan: 31 y.o.   POD# 2.  s/p Cesarean Delivery.  Indications: Twins                Principal Problem:  *PP care - s/p 1C/S 4/18 (twins) Active Problems:  Protein S deficiency complicating pregnancy - delivered  Borderline hypertension  Doing well, stable.    Resume anticoagulant - Heparin -  today          Advance diet as tolerated Hold Motrin Ambulate Routine post-op care Lactation support for twins BFing  Contact dermatitis from surgical drape adhesive  hydrocortisone 1% cream BID to affected area.   PAUL,DANIELA 08/06/2011, 6:40 AM  MD note- Pt seen and agree with above note and plan. Stop Metformin for now. Heparin 5000 units every 12hrs for 1 wk, then switch to Lovenox 40 mg q12hr until 6 wks (will discuss with Heme if 12 wks postpartum anticoagulation desired). Warning s/s of internal or incision bleeding reviewed.  --V.Juliene Pina, MD

## 2011-08-07 MED ORDER — OXYCODONE-ACETAMINOPHEN 5-325 MG PO TABS: 1.0000 | ORAL_TABLET | ORAL | Status: AC | PRN

## 2011-08-07 MED ORDER — HEPARIN SODIUM (PORCINE) 10000 UNIT/ML IJ SOLN: 5000.0000 [IU] | Freq: Two times a day (BID) | INTRAMUSCULAR | Status: DC

## 2011-08-07 MED ORDER — HEPARIN SODIUM (PORCINE) 5000 UNIT/ML IJ SOLN
5000.0000 [IU] | Freq: Two times a day (BID) | INTRAMUSCULAR | Status: DC
  Administered 2011-08-07: 5000 [IU] via SUBCUTANEOUS
  Filled 2011-08-07: qty 1

## 2011-08-07 MED ORDER — ENOXAPARIN SODIUM 40 MG/0.4ML ~~LOC~~ SOLN: 40.0000 mg | Freq: Every day | SUBCUTANEOUS | Status: DC

## 2011-08-07 MED ORDER — ONDANSETRON HCL 4 MG PO TABS: 8.0000 mg | ORAL_TABLET | Freq: Three times a day (TID) | ORAL | Status: AC | PRN

## 2011-08-07 NOTE — Progress Notes (Addendum)
Subjective: POD# 3 Information for the patient's newborn:  Wyndi, Northrup [[id number]]  female Information for the patient's newborn:  Shalona, Harbour [[id number]]  female   / circ complete x 2  Reports feeling tired, but well, increased pain this am but still able to sleep. Feeding: breast Patient reports tolerating PO.  Breast symptoms: none Pain controlled with Percocet, some soreness throughout abdomen and (+) cramping with BFing Denies HA/SOB/C/P/dizziness. (+) nausea, no emesis. Flatus present. She reports vaginal bleeding as normal, without clots.  She is ambulating, Voiding well.  Objective:   VS:  Filed Vitals:   08/06/11 0619 08/06/11 1446 08/06/11 2103 08/07/11 0602  BP: 137/84 124/80 141/85 136/85  Pulse: 81 89 96 90  Temp: 98.2 F (36.8 C) 98 F (36.7 C) 98.1 F (36.7 C) 98.6 F (37 C)  TempSrc: Oral  Oral Oral  Resp: 18 18 18 20   Weight:      SpO2:         No intake or output data in the 24 hours ending 08/07/11 0914       Basename 08/05/11 0545 08/04/11 1239  WBC 11.4* 10.1  HGB 11.6* 12.6  HCT 35.8* 38.5  PLT 123* 155     Blood type: --/--/O POS (04/18 1200)  Rubella: Immune (10/23 0000)     Physical Exam:  General: alert, cooperative and no distress CV: Regular rate and rhythm Resp: clear Abdomen: soft, nontender, normal bowel sounds, + gas distention mild, Rash on abdomen resolved Incision: clean, dry, intact and staples intact, no erythema, no ecchymosis  Uterine Fundus: firm, below umbilicus, nontender Lochia: minimal Ext: edema +1 pedal and Homans sign is negative, no sign of DVT      Assessment/Plan: 31 y.o.   POD# 3.  s/p Cesarean Delivery.  Indications: Twins                Principal Problem:  *PP care - s/p 1C/S 4/18 (twins) Active Problems:  Protein S deficiency complicating pregnancy - delivered  Borderline hypertension  Doing well, stable.  Persistent nausea, no evidence of illeus , likely r/t Percocet use. Will give  Zofran PRN       Advance diet as tolerated Hold Motrin Routine post-op care Outpatient lactation support for twins BFing DC home with WOB instructions Staples removal prior to DC  Heparin 5000 units every 12hrs for 1 wk, then switch to Lovenox 40 mg q24hr until 6 wks (will discuss with Heme if 12 wks postpartum anticoagulation desired). Warning s/s of internal or incision bleeding reviewed.    Kenlei Safi 08/07/2011, 9:14 AM

## 2011-08-07 NOTE — Discharge Summary (Signed)
Reviewed, agree with note and plan.  

## 2011-08-07 NOTE — Discharge Summary (Signed)
POSTOPERATIVE DISCHARGE SUMMARY:  Patient ID: Leah Moreno MRN: [id number] DOB/AGE: 1980/12/07 31 y.o.  Admit date: 08/04/2011 Discharge date:  08/07/2011   Admission Diagnoses: 1. Term twin gestation for elective primary cesarean section 2. 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 Borderline hypertension 4. History polycystic ovarian syndrome 5. Assisted reproduction pregnancy (in-vitro fertilization) 6. Impaired glucose metabolism on metformin   Discharge Diagnoses:  1. Term Pregnancy-delivered 2. Post-operative day 3, stable 3. Protein S defficiency, continuing anticoagulation therapy  Prenatal history: G1P1002   EDC : 08/19/2011, by Last Menstrual Period  Prenatal care at [facility] since 1st trimester  Prenatal course complicated by: 1. Twins - IVF 2. Carpel tunnel and borderline BPs, no PEC.   3. PCOS, on metformin 4. Protein S deficiency on Lovenox antepartum with change to Heparin at 36 weeks 5. Twin B renal hydronephrosis- resolved at 37 wks.    Prenatal Labs: ABO, Rh: O (10/23 0000)  Antibody: NEG (04/18 1158) Rubella: Immune (10/23 0000)  RPR: NON REACTIVE (04/15 1430)  HBsAg: Negative (10/23 0000)  HIV: Non-reactive (10/23 0000)  GBS:   positive  1 hr Glucola : 117   Medical / Surgical History :  Past medical history:  Past Medical History  Diagnosis Date  . Blood dyscrasia     Protein S deficient, prothrombing gene factor 2  . Insulin resistance     takes Metformin- has PCOS  . Heartburn in pregnancy   . Pregnancy induced hypertension 2013  . PP care - s/p 1C/S 4/18 (twins) 08/05/2011  . Protein S deficiency complicating pregnancy - delivered 08/05/2011  . Borderline hypertension 08/05/2011  . Thrombocytopenia due to blood loss 08/05/2011  . Thrombocytopenia due to blood loss 08/05/2011    Past surgical history:  Past Surgical History  Procedure Date  . Ivf     egg retrieval    Family History: No family history  on file.  Social History:  reports that she has never smoked. She does not have any smokeless tobacco history on file. She reports that she does not drink alcohol or use illicit drugs.   Allergies: Naproxen    Current Medications at time of admission:  Prescriptions prior to admission  Medication Sig Dispense Refill  . docusate sodium (COLACE) 100 MG capsule Take 100 mg by mouth 2 (two) times daily.      . famotidine-calcium carbonate-magnesium hydroxide (PEPCID COMPLETE) 10-800-165 MG CHEW Chew 1 tablet by mouth 2 (two) times daily.      . Iron-DSS-B12-FA-C-E-Cu-Biotin (HEMAX) 150-1 MG TABS Take 1 tablet by mouth at bedtime.      Marland Kitchen loratadine (CLARITIN) 10 MG tablet Take 10 mg by mouth daily.      . Prenatal Vit-Fe Fumarate-FA (PRENATAL MULTIVITAMIN) TABS Take 1 tablet by mouth daily.      Marland Kitchen DISCONTD: Aspirin 81 MG EC tablet Take 81 mg by mouth daily.      Marland Kitchen DISCONTD: enoxaparin (LOVENOX) 40 MG/0.4ML injection Inject 40 mg into the skin daily. For protein S deficiency and rare prothrombin gene factor II, sees heme at [facility]  Will stop on 4/4 per Dr. Juliene Pina      . DISCONTD: metFORMIN (GLUCOPHAGE) 1000 MG tablet Take 1,000 mg by mouth 2 (two) times daily with a meal.          Admit labs:  08/04/2011 12:39  WBC 10.1  RBC 4.43  HGB 12.6  HCT 38.5  MCV 86.9  MCH 28.4  MCHC 32.7  RDW 14.8  Platelets 155   Intrapartum Course: n/a  Procedures: Cesarean section delivery of twin female newborns by Dr Juliene Pina  See operative report for further details  Postoperative / postpartum course: uneventful. Patient resumed anticoagulation therapy at 48 hours post-op.  Physical Exam:  VSS: Blood pressure 136/85, pulse 90, temperature 98.6 F (37 C), temperature source Oral, resp. rate 20, weight 97.977 kg (216 lb), last menstrual period 11/12/2010, SpO2 98.00%, unknown if currently breastfeeding.   LABS:  Basename 08/05/11 0545 08/04/11 1239  WBC 11.4* 10.1  HGB 11.6* 12.6  HCT 35.8* 38.5  PLT  123* 155     Incision:  approximated with staples / no erythema / no ecchymosis / no drainage Staples:  removed prior to discharge and replaced with benzoin and steri strips.   Discharge Instructions:  Discharged Condition: good Activity: pelvic rest and weight lifting and driving restrictions x 2 weeks Diet: routine Medications:  Medication List  As of 08/07/2011  9:36 AM   STOP taking these medications         Aspirin 81 MG EC tablet      metFORMIN 1000 MG tablet         TAKE these medications         docusate sodium 100 MG capsule   Commonly known as: COLACE   Take 100 mg by mouth 2 (two) times daily.      enoxaparin 40 MG/0.4ML injection   Commonly known as: LOVENOX   Inject 0.4 mLs (40 mg total) into the skin daily. For protein S deficiency and rare prothrombin gene factor II, sees heme at [facility] after 1 week of Heparin therapy completed      HEMAX 150-1 MG Tabs   Take 1 tablet by mouth at bedtime.      heparin 91478 UNIT/ML injection   Inject 0.5 mLs (5,000 Units total) into the skin 2 (two) times daily. Continue for 1 week postpartum.      loratadine 10 MG tablet   Commonly known as: CLARITIN   Take 10 mg by mouth daily.      ondansetron 4 MG tablet   Commonly known as: ZOFRAN   Take 2 tablets (8 mg total) by mouth every 8 (eight) hours as needed for nausea.      oxyCODONE-acetaminophen 5-325 MG per tablet   Commonly known as: PERCOCET   Take 1-2 tablets by mouth every 3 (three) hours as needed (moderate - severe pain).      PEPCID COMPLETE 10-800-165 MG Chew   Generic drug: famotidine-calcium carbonate-magnesium hydroxide   Chew 1 tablet by mouth 2 (two) times daily.      prenatal multivitamin Tabs   Take 1 tablet by mouth daily.           Condition: stable Postpartum Instructions: refer to practice specific booklet Discharge to: home Disposition: Final discharge disposition not confirmed Follow up :  Follow-up Information     Follow up with MODY,VAISHALI R, MD in 6 weeks.   Contact information:   [address] [place] [address] [phone]           Signed: Arlan Organ 08/07/2011, 9:36 AM

## 2011-08-07 NOTE — Discharge Instructions (Signed)
Breast Pumping Tips Pumping your breast milk is a good way to stimulate milk production and have a steady supply of breast milk for your infant. Pumping is most helpful during your infant's growth spurts, when involving dad or a family member, or when you are away. There are several types of pumps available. They can be purchased at a baby or maternity store. You can begin pumping soon after delivery, but some experts believe that you should wait about four weeks to give your infant a bottle. In general, the more you breastfeed or pump, the more milk you will have for your infant. It is also important to take good care of yourself. This will reduce stress and help your body to create a healthy supply of milk. Your caregiver or lactation consultant can give you the information and support you need in your efforts to breastfeed your infant. PUMPING BREAST MILK  Follow the tips below for successful breast pumping. Take care of yourself.  Drink enough water or fluids to keep urine clear or pale yellow. You may notice a thirsty feeling while breastfeeding. This is because your body needs more water to make breast milk. Keep a large water bottle handy. Make healthy drink choices such as unsweetened fruit juice, milk and water. Limit soda, coffee, and alcohol (wait 2 hours to feed or pump if you have an alcoholic drink.)   Eat a healthy, well-balanced diet rich in fruits, vegetables, and whole grains.   Exercise as recommended by your caregiver.   Get plenty of sleep. Sleep when your infant sleeps. Ask friends and family for help if you need time to nap or rest.   Do not smoke. Smoking can lower your milk supply and harm your infant. If you need help quitting, ask your caregiver for a program recommendation.   Ask your caregiver about birth control options. Birth control pills may lower your milk supply. You may be advised to use condoms or other forms of birth control.  Relax and pump Stimulating your  let-down reflex is the key to successful and effective pumping. This makes the milk in all parts of the breast flow more freely.   It is easier to pump breast milk (and breastfeed) while you are relaxed. Find techniques that work for you. Quiet private spaces, breast massage, soothing heat placed on the breast, music, and pictures or a tape recording of your infant may help you to relax and "let down" your milk. If you have difficulty with your let down, try smelling one of your infant's blankets or an item of clothing he or she has worn while you are pumping.   When pumping, place the special suction cup (flange) directly over the nipple. It may be uncomfortable and cause nipple damage if it is not placed properly or is the wrong size. Applying a small amount of purified or modified lanolin to your nipple and the areola may help increase your comfort level. Also, you can change the speed and suction of many electric pumps to your comfort level. Your caregiver or lactation consultant can help you with this.   If pumping continues to be painful, or you feel you are not getting very much milk when you pump, you may need a different type of pump. A lactation consultant can help you determine if this is the case.   If you are with your infant, feed him or her on demand and try pumping after each feeding. This will boost your production, even if milk   does not come out. You may not be able to pump much milk at first, but keep up the routine, and this will change.   If you are working or away from your infant for several hours, try pumping for about 15 minutes every 2 to 3 hours. Pump both breasts at the same time if you can.   If your infant has a formula feeding, make sure you pump your milk around the same time to maintain your supply.   Begin pumping breast milk a few weeks before you return to work. This will help you develop techniques that work for you and will be able to store extra milk.   Find a  source of breastfeeding information that works well for you.  TIPS FOR STORING BREAST MILK  Store breast milk in a sealable sterile bag, jar, or container provided with your pumping supplies.   Store milk in small amounts close to what your infant is drinking at each feeding.   Cool pumped milk in a refrigerator or cooler. Pumped milk can last at the back of the refrigerator for 3 to 8 days.   Place cooled milk at the back of the freezer for up to 3 months.   Thaw the milk in its container or bag in warm water up to 24 hours in advance. Do not use a microwave to thaw or heat milk. Do not refreeze the milk after it has been thawed.   Breast milk is safe to drink when left at room temperature (mid 70s or colder) for 4 to 8 hours. After that, throw it away.   Milk fat can separate and look funny. The color can vary slightly from day to day. This is normal. Always shake the milk before using it to mix the fat with the more watery portion.  SEEK MEDICAL CARE IF:   You are having trouble pumping or feeding your infant.   You are concerned that you are not making enough milk.   You have nipple pain, soreness, or redness.   You have other questions or concerns related to you or your infant.  Document Released: 09/22/2009 Document Revised: 03/24/2011 Document Reviewed: 09/22/2009 [employer] Patient Information 2012 [employer].Postpartum Depression and Baby Blues The postpartum period begins right after the birth of a baby. During this time, there is often a great amount of joy and excitement. It is also a time of considerable changes in the life of the parent(s). Regardless of how many times a mother gives birth, each child brings new challenges and dynamics to the family. It is not unusual to have feelings of excitement accompanied by confusing shifts in moods, emotions, and thoughts. All mothers are at risk of developing postpartum depression or the "baby blues." These mood changes can occur  right after giving birth, or they may occur many months after giving birth. The baby blues or postpartum depression can be mild or severe. Additionally, postpartum depression can resolve rather quickly, or it can be a long-term condition. CAUSES Elevated hormones and their rapid decline are thought to be a main cause of postpartum depression and the baby blues. There are a number of hormones that radically change during and after pregnancy. Estrogen and progesterone usually decrease immediately after delivering your baby. The level of thyroid hormone and various cortisol steroids also rapidly drop. Other factors that play a major role in these changes include major life events and genetics.  RISK FACTORS If you have any of the following risks for the   baby blues or postpartum depression, know what symptoms to watch out for during the postpartum period. Risk factors that may increase the likelihood of getting the baby blues or postpartum depression include:  Havinga personal or family history of depression.   Having depression while being pregnant.   Having premenstrual or oral contraceptive-associated mood issues.   Having exceptional life stress.   Having marital conflict.   Lacking a social support network.   Having a baby with special needs.   Having health problems such as diabetes.  SYMPTOMS Baby blues symptoms include:  Brief fluctuations in mood, such as going from extreme happiness to sadness.   Decreased concentration.   Difficulty sleeping.   Crying spells, tearfulness.   Irritability.   Anxiety.  Postpartum depression symptoms typically begin within the first month after giving birth. These symptoms include:  Difficulty sleeping or excessive sleepiness.   Marked weight loss.   Agitation.   Feelings of worthlessness.   Lack of interest in activity or food.  Postpartum psychosis is a very concerning condition and can be dangerous. Fortunately, it is rare.  Displaying any of the following symptoms is cause for immediate medical attention. Postpartum psychosis symptoms include:  Hallucinations and delusions.   Bizarre or disorganized behavior.   Confusion or disorientation.  DIAGNOSIS  A diagnosis is made by an evaluation of your symptoms. There are no medical or lab tests that lead to a diagnosis, but there are various questionnaires that a caregiver may use to identify those with the baby blues, postpartum depression, or psychosis. Often times, a screening tool called the Edinburgh Postnatal Depression Scale is used to diagnose depression in the postpartum period.  TREATMENT The baby blues usually goes away on its own in 1 to 2 weeks. Social support is often all that is needed. You should be encouraged to get adequate sleep and rest. Occasionally, you may be given medicines to help you sleep.  Postpartum depression requires treatment as it can last several months or longer if it is not treated. Treatment may include individual or group therapy, medicine, or both to address any social, physiological, and psychological factors that may play a role in the depression. Regular exercise, a healthy diet, rest, and social support may also be strongly recommended.  Postpartum psychosis is more serious and needs treatment right away. Hospitalization is often needed. HOME CARE INSTRUCTIONS  Get as much rest as you can. Nap when the baby sleeps.   Exercise regularly. Some women find yoga and walking to be beneficial.   Eat a balanced and nourishing diet.   Do little things that you enjoy. Have a cup of tea, take a bubble bath, read your favorite magazine, or listen to your favorite music.   Avoid alcohol.   Ask for help with household chores, cooking, grocery shopping, or running errands as needed. Do not try to do everything.   Talk to people close to you about how you are feeling. Get support from your partner, family members, friends, or other new  moms.   Try to stay positive in how you think. Think about the things you are grateful for.   Do not spend a lot of time alone.   Only take medicine as directed by your caregiver.   Keep all your postpartum appointments.   Let your caregiver know if you have any concerns.  SEEK MEDICAL CARE IF: You are having a reaction or problems with your medicine. SEEK IMMEDIATE MEDICAL CARE IF:  You have suicidal feelings.     You feel you may harm the baby or someone else.  Document Released: 01/07/2004 Document Revised: 03/24/2011 Document Reviewed: 02/08/2011 [employer] Patient Information 2012 [employer].Postpartum Depression and Baby Blues The postpartum period begins right after the birth of a baby. During this time, there is often a great amount of joy and excitement. It is also a time of considerable changes in the life of the parent(s). Regardless of how many times a mother gives birth, each child brings new challenges and dynamics to the family. It is not unusual to have feelings of excitement accompanied by confusing shifts in moods, emotions, and thoughts. All mothers are at risk of developing postpartum depression or the "baby blues." These mood changes can occur right after giving birth, or they may occur many months after giving birth. The baby blues or postpartum depression can be mild or severe. Additionally, postpartum depression can resolve rather quickly, or it can be a long-term condition. CAUSES Elevated hormones and their rapid decline are thought to be a main cause of postpartum depression and the baby blues. There are a number of hormones that radically change during and after pregnancy. Estrogen and progesterone usually decrease immediately after delivering your baby. The level of thyroid hormone and various cortisol steroids also rapidly drop. Other factors that play a major role in these changes include major life events and genetics.  RISK FACTORS If you have any of the  following risks for the baby blues or postpartum depression, know what symptoms to watch out for during the postpartum period. Risk factors that may increase the likelihood of getting the baby blues or postpartum depression include:  Havinga personal or family history of depression.   Having depression while being pregnant.   Having premenstrual or oral contraceptive-associated mood issues.   Having exceptional life stress.   Having marital conflict.   Lacking a social support network.   Having a baby with special needs.   Having health problems such as diabetes.  SYMPTOMS Baby blues symptoms include:  Brief fluctuations in mood, such as going from extreme happiness to sadness.   Decreased concentration.   Difficulty sleeping.   Crying spells, tearfulness.   Irritability.   Anxiety.  Postpartum depression symptoms typically begin within the first month after giving birth. These symptoms include:  Difficulty sleeping or excessive sleepiness.   Marked weight loss.   Agitation.   Feelings of worthlessness.   Lack of interest in activity or food.  Postpartum psychosis is a very concerning condition and can be dangerous. Fortunately, it is rare. Displaying any of the following symptoms is cause for immediate medical attention. Postpartum psychosis symptoms include:  Hallucinations and delusions.   Bizarre or disorganized behavior.   Confusion or disorientation.  DIAGNOSIS  A diagnosis is made by an evaluation of your symptoms. There are no medical or lab tests that lead to a diagnosis, but there are various questionnaires that a caregiver may use to identify those with the baby blues, postpartum depression, or psychosis. Often times, a screening tool called the Edinburgh Postnatal Depression Scale is used to diagnose depression in the postpartum period.  TREATMENT The baby blues usually goes away on its own in 1 to 2 weeks. Social support is often all that is needed.  You should be encouraged to get adequate sleep and rest. Occasionally, you may be given medicines to help you sleep.  Postpartum depression requires treatment as it can last several months or longer if it is not treated. Treatment may include individual or   group therapy, medicine, or both to address any social, physiological, and psychological factors that may play a role in the depression. Regular exercise, a healthy diet, rest, and social support may also be strongly recommended.  Postpartum psychosis is more serious and needs treatment right away. Hospitalization is often needed. HOME CARE INSTRUCTIONS  Get as much rest as you can. Nap when the baby sleeps.   Exercise regularly. Some women find yoga and walking to be beneficial.   Eat a balanced and nourishing diet.   Do little things that you enjoy. Have a cup of tea, take a bubble bath, read your favorite magazine, or listen to your favorite music.   Avoid alcohol.   Ask for help with household chores, cooking, grocery shopping, or running errands as needed. Do not try to do everything.   Talk to people close to you about how you are feeling. Get support from your partner, family members, friends, or other new moms.   Try to stay positive in how you think. Think about the things you are grateful for.   Do not spend a lot of time alone.   Only take medicine as directed by your caregiver.   Keep all your postpartum appointments.   Let your caregiver know if you have any concerns.  SEEK MEDICAL CARE IF: You are having a reaction or problems with your medicine. SEEK IMMEDIATE MEDICAL CARE IF:  You have suicidal feelings.   You feel you may harm the baby or someone else.  Document Released: 01/07/2004 Document Revised: 03/24/2011 Document Reviewed: 02/08/2011 [employer] Patient Information 2012 [employer]. 

## 2011-08-08 ENCOUNTER — Encounter (HOSPITAL_COMMUNITY): Payer: Self-pay | Admitting: Obstetrics & Gynecology

## 2011-08-10 ENCOUNTER — Ambulatory Visit (HOSPITAL_COMMUNITY)
Admit: 2011-08-10 | Discharge: 2011-08-10 | Disposition: A | Discharge status: Home / Self Care | Payer: BC Managed Care – PPO | Attending: Obstetrics & Gynecology | Admitting: Obstetrics & Gynecology

## 2011-08-10 NOTE — Progress Notes (Signed)
Adult Lactation Consultation Outpatient Visit Note  Patient Name: Leah Moreno(mom)    TWINS   A Leah Moreno  BW 6-2.1  WEIGHT TODAY: 5-11.9 Date of Birth: [birth year]                                         B Leah Moreno BW 5-3.4 WEIGHT TODAY: 5-0.2 Gestational Age at Delivery: [redacted]w[redacted]d         DOB:  [birth year] Type of Delivery: C/S  Breastfeeding History: Frequency of Breastfeeding: every 2-3 hours Length of Feeding: 10-25 minutes Voids: A&B 6+ Stools: A&B 3+ yellow  Supplementing / Method:Formula 30 mls every 3 hours per bottle Pumping:  Type of Pump:manual/will obtain pump in style today   Frequency:none  Volume:    Comments:    Consultation Evaluation:Mom here with 6 day old twins for feeding assessment.  Mom has hx of PCOS and infertility/IVF.  Mom states milk came in 2 days ago because she noticed color of milk change to white and easier to express.  Denies any significant fullness or engorgement.  Breasts very soft today.  Mom states both babies are latching but Leah Moreno much sleepier at breast.  Assisted mom with positioning Leah Moreno on right breast and baby latched fairly well but falled off breast frequently. 20 mm nipple shield used and baby nursed more actively but few swallows heard.  Parent's shown how to use good breast compression/massage during feeding to increase intake.  Baby nursed 20 minutes with only 2 ml transfer.  Assisted with positioning Leah Moreno in football hold on left breast.  Leah Moreno has fair latch and nurses with more non-nutritive sucking.  Nipple shield attempted but baby thrusts shield out with tongue.  Discussed using SNS to supplement babies at some feedings and mom willing.  Leah Moreno latched well with SNS and obtained deeper latch and nursed much more actively.  Baby took 45 mls of formula per SNS in 15 minutes and 11 mls transfer breast milk.Plan: 1) work on increasing milk supply by using SNS, pumping breasts after feeding, begin fenugreek 2) continue to breastfeed babies  with feeding cues or at least every 3 hours  3) make sure babies obtain wide deep latch and use good breast massage prior to and during feeding to increase intake 4) Either supplement babies per bottle pc or SNS at feeding with 45 mls-14mls every 3 hours  Initial Feeding Assessment: Pre-feed Weight: Post-feed Weight: Amount Transferred: Comments:  Additional Feeding Assessment:Baby A Leah Moreno right breast 20 minutes Pre-feed [id number] Post-feed [id number] Amount Transferred:2 mls Comments:  Additional Feeding Assessment:Baby B Leah Moreno left breast 20 min Pre-feed [id number] Post-feed [id number] Amount Transferred:11 mls Comments:  Total Breast milk Transferred this Visit:  Total Supplement Given: A 40 mls formula per bottle   B 45 mls formula per SNS  Additional Interventions:   Follow-Up weight check at MD on Tuesday 08/16/11/outpatient lactation appointment 07/18/11      Leah Moreno 08/10/2011, 4:47 PM

## 2011-08-17 ENCOUNTER — Encounter (HOSPITAL_COMMUNITY): Payer: BC Managed Care – PPO

## 2011-08-24 ENCOUNTER — Encounter (HOSPITAL_COMMUNITY): Payer: BC Managed Care – PPO

## 2014-02-17 ENCOUNTER — Encounter (HOSPITAL_COMMUNITY): Payer: Self-pay | Admitting: Obstetrics & Gynecology

## 2014-03-18 LAB — OB RESULTS CONSOLE ABO/RH: RH Type: POSITIVE

## 2014-03-18 LAB — OB RESULTS CONSOLE ANTIBODY SCREEN: Antibody Screen: NEGATIVE

## 2014-03-18 LAB — OB RESULTS CONSOLE HIV ANTIBODY (ROUTINE TESTING): HIV: NONREACTIVE

## 2014-03-21 LAB — OB RESULTS CONSOLE GC/CHLAMYDIA
CHLAMYDIA, DNA PROBE: NEGATIVE
Gonorrhea: NEGATIVE

## 2014-04-01 LAB — OB RESULTS CONSOLE RUBELLA ANTIBODY, IGM
RUBELLA: IMMUNE
Rubella: IMMUNE

## 2014-04-01 LAB — OB RESULTS CONSOLE ABO/RH: RH Type: POSITIVE

## 2014-04-01 LAB — OB RESULTS CONSOLE RPR: RPR: NONREACTIVE

## 2014-04-01 LAB — OB RESULTS CONSOLE ANTIBODY SCREEN: ANTIBODY SCREEN: NEGATIVE

## 2014-04-01 LAB — OB RESULTS CONSOLE HEPATITIS B SURFACE ANTIGEN: Hepatitis B Surface Ag: NEGATIVE

## 2014-08-30 ENCOUNTER — Inpatient Hospital Stay (HOSPITAL_COMMUNITY)
Admission: AD | Admit: 2014-08-30 | Discharge: 2014-08-30 | Disposition: A | Discharge status: Home / Self Care | Payer: BLUE CROSS/BLUE SHIELD | Source: Ambulatory Visit | Attending: Obstetrics | Admitting: Obstetrics

## 2014-08-30 ENCOUNTER — Encounter (HOSPITAL_COMMUNITY): Payer: Self-pay

## 2014-08-30 ENCOUNTER — Inpatient Hospital Stay (HOSPITAL_COMMUNITY): Payer: BLUE CROSS/BLUE SHIELD

## 2014-08-30 DIAGNOSIS — Z3A33 33 weeks gestation of pregnancy: Secondary | ICD-10-CM | POA: Diagnosis not present

## 2014-08-30 DIAGNOSIS — O133 Gestational [pregnancy-induced] hypertension without significant proteinuria, third trimester: Secondary | ICD-10-CM | POA: Diagnosis present

## 2014-08-30 DIAGNOSIS — O139 Gestational [pregnancy-induced] hypertension without significant proteinuria, unspecified trimester: Secondary | ICD-10-CM

## 2014-08-30 DIAGNOSIS — O24419 Gestational diabetes mellitus in pregnancy, unspecified control: Secondary | ICD-10-CM | POA: Diagnosis not present

## 2014-08-30 LAB — URINALYSIS, ROUTINE W REFLEX MICROSCOPIC
BILIRUBIN URINE: NEGATIVE
Glucose, UA: NEGATIVE mg/dL
Ketones, ur: NEGATIVE mg/dL
LEUKOCYTES UA: NEGATIVE
Nitrite: NEGATIVE
Protein, ur: NEGATIVE mg/dL
SPECIFIC GRAVITY, URINE: 1.015 (ref 1.005–1.030)
UROBILINOGEN UA: 0.2 mg/dL (ref 0.0–1.0)
pH: 7 (ref 5.0–8.0)

## 2014-08-30 LAB — CBC
HCT: 33.7 % — ABNORMAL LOW (ref 36.0–46.0)
HEMOGLOBIN: 11.3 g/dL — AB (ref 12.0–15.0)
MCH: 28.9 pg (ref 26.0–34.0)
MCHC: 33.5 g/dL (ref 30.0–36.0)
MCV: 86.2 fL (ref 78.0–100.0)
Platelets: 225 10*3/uL (ref 150–400)
RBC: 3.91 MIL/uL (ref 3.87–5.11)
RDW: 14.7 % (ref 11.5–15.5)
WBC: 13.3 10*3/uL — ABNORMAL HIGH (ref 4.0–10.5)

## 2014-08-30 LAB — COMPREHENSIVE METABOLIC PANEL
ALT: 11 U/L — ABNORMAL LOW (ref 14–54)
AST: 14 U/L — ABNORMAL LOW (ref 15–41)
Albumin: 3 g/dL — ABNORMAL LOW (ref 3.5–5.0)
Alkaline Phosphatase: 89 U/L (ref 38–126)
Anion gap: 9 (ref 5–15)
BUN: 10 mg/dL (ref 6–20)
CO2: 22 mmol/L (ref 22–32)
Calcium: 8.7 mg/dL — ABNORMAL LOW (ref 8.9–10.3)
Chloride: 106 mmol/L (ref 101–111)
Creatinine, Ser: 0.55 mg/dL (ref 0.44–1.00)
GFR calc non Af Amer: >60 mL/min (ref >60)
Glucose, Bld: 85 mg/dL (ref 65–99)
Potassium: 3.9 mmol/L (ref 3.5–5.1)
SODIUM: 137 mmol/L (ref 135–145)
Total Bilirubin: 0.2 mg/dL — ABNORMAL LOW (ref 0.3–1.2)
Total Protein: 6.5 g/dL (ref 6.5–8.1)

## 2014-08-30 LAB — PROTEIN / CREATININE RATIO, URINE
Creatinine, Urine: 67 mg/dL
PROTEIN CREATININE RATIO: 0.18 mg/mg{creat} — AB (ref 0.00–0.15)
Total Protein, Urine: 12 mg/dL

## 2014-08-30 LAB — URINE MICROSCOPIC-ADD ON

## 2014-08-30 LAB — URIC ACID: URIC ACID, SERUM: 5.4 mg/dL (ref 2.3–6.6)

## 2014-08-30 NOTE — MAU Provider Note (Signed)
History     CSN: [id number]  Arrival date and time: 08/30/14 1756   None     Chief Complaint  Patient presents with  . Hypertension   HPI Comments: [id number] @33 .6 wks c/o increased BPs at home today ranging from 105-160/90-100. Denies HA. Some visual spots present but not new sx. No epigastric pain. Good FM. No ctx, VB, or LOF. 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  Hypertension    OB History    Gravida Para Term Preterm AB TAB SAB Ectopic Multiple Living   2 1 1      1 2       Past Medical History  Diagnosis Date  . Blood dyscrasia     Protein S deficient, prothrombing gene factor 2  . Insulin resistance     takes Metformin- has PCOS  . Heartburn in pregnancy   . Pregnancy induced hypertension 2013  . PP care - s/p 1C/S 4/18 (twins) 08/05/2011  . Protein S deficiency complicating pregnancy - delivered 08/05/2011  . Borderline hypertension 08/05/2011  . Thrombocytopenia due to blood loss 08/05/2011  . Thrombocytopenia due to blood loss 08/05/2011    Past Surgical History  Procedure Laterality Date  . Ivf      egg retrieval  . Cesarean section  08/04/2011    Procedure: CESAREAN SECTION;  Surgeon: Elveria Royals, MD;  Location: [facility];  Service: Gynecology;  Laterality: N/A;  Twins  EDD: 08/19/11    History reviewed. No pertinent family history.  History  Substance Use Topics  . Smoking status: Never Smoker   . Smokeless tobacco: Not on file  . Alcohol Use: No    Allergies:  Allergies  Allergen Reactions  . Naproxen     GI upset    Prescriptions prior to admission  Medication Sig Dispense Refill Last Dose  . aspirin 81 MG tablet Take 81 mg by mouth daily.   08/30/2014 at Unknown time  . enoxaparin (LOVENOX) 40 MG/0.4ML injection Inject 0.4 mLs (40 mg total) into the skin daily. For protein S deficiency and rare prothrombin gene  factor II, sees heme at [facility] after 1 week of Heparin therapy completed 1 Syringe 0 08/29/2014 at Unknown time  . ferrous sulfate 325 (65 FE) MG tablet Take 325 mg by mouth daily with breakfast.   08/30/2014 at Unknown time  . loratadine (CLARITIN) 10 MG tablet Take 10 mg by mouth daily.   08/30/2014 at Unknown time  . metFORMIN (GLUCOPHAGE) 500 MG tablet Take 250-500 mg by mouth 3 (three) times daily.   08/30/2014 at Unknown time  . Prenatal Vit-Fe Fumarate-FA (PRENATAL MULTIVITAMIN) TABS Take 1 tablet by mouth daily.   08/30/2014 at Unknown time  . heparin 10000 UNIT/ML injection Inject 0.5 mLs (5,000 Units total) into the skin 2 (two) times daily. Continue for 1 week postpartum. 5 mL 0     Review of Systems  Constitutional: Negative.   HENT: Negative.   Eyes: Negative.   Respiratory: Negative.   Cardiovascular: Negative.   Gastrointestinal: Negative.   Genitourinary: Negative.   Musculoskeletal: Negative.   Skin: Negative.   Neurological: Negative.   Endo/Heme/Allergies: Negative.   Psychiatric/Behavioral: Negative.   EFM: 150 bpm, mod variability, +accels, no decels Toco: none  Physical Exam   Blood pressure 128/81, pulse 112, temperature 99.4 F (37.4 C), temperature source Oral, resp. rate 18, height 5'  3" (1.6 m), weight 92.307 kg (203 lb 8 oz), SpO2 100 %, not currently breastfeeding. BP range: 128-152/81-89  Physical Exam  Constitutional: She is oriented to person, place, and time. She appears well-developed and well-nourished.  HENT:  Head: Normocephalic and atraumatic.  Neck: Normal range of motion. Neck supple.  Cardiovascular: Normal rate and regular rhythm.   Respiratory: Effort normal and breath sounds normal.  GI: Soft. Bowel sounds are normal.  Genitourinary:  deferred  Neurological: She is alert and oriented to person, place, and time. She has normal reflexes.  Skin: Skin is warm and dry.    MAU Course  Procedures Results for Leah Moreno, Leah Moreno (MRN  [id number]) as of 08/30/2014 20:34  Ref. Range 08/30/2014 18:13 08/30/2014 18:35  Sodium Latest Ref Range: 135-145 mmol/L  137  Potassium Latest Ref Range: 3.5-5.1 mmol/L  3.9  Chloride Latest Ref Range: 101-111 mmol/L  106  CO2 Latest Ref Range: 22-32 mmol/L  22  BUN Latest Ref Range: 6-20 mg/dL  10  Creatinine Latest Ref Range: 0.44-1.00 mg/dL  0.55  Calcium Latest Ref Range: 8.9-10.3 mg/dL  8.7 (L)  EGFR (Non-African Amer.) Latest Ref Range: >60 mL/min  >60  EGFR (African American) Latest Ref Range: >60 mL/min  >60  Glucose Latest Ref Range: 65-99 mg/dL  85  Anion gap Latest Ref Range: 5-15   9  Alkaline Phosphatase Latest Ref Range: 38-126 U/L  89  Albumin Latest Ref Range: 3.5-5.0 g/dL  3.0 (L)  Uric Acid, Serum Latest Ref Range: 2.3-6.6 mg/dL  5.4  AST Latest Ref Range: 15-41 U/L  14 (L)  ALT Latest Ref Range: 14-54 U/L  11 (L)  Total Protein Latest Ref Range: 6.5-8.1 g/dL  6.5  Total Bilirubin Latest Ref Range: 0.3-1.2 mg/dL  0.2 (L)  WBC Latest Ref Range: 4.0-10.5 K/uL  13.3 (H)  RBC Latest Ref Range: 3.87-5.11 MIL/uL  3.91  Hemoglobin Latest Ref Range: 12.0-15.0 g/dL  11.3 (L)  HCT Latest Ref Range: 36.0-46.0 %  33.7 (L)  MCV Latest Ref Range: 78.0-100.0 fL  86.2  MCH Latest Ref Range: 26.0-34.0 pg  28.9  MCHC Latest Ref Range: 30.0-36.0 g/dL  33.5  RDW Latest Ref Range: 11.5-15.5 %  14.7  Platelets Latest Ref Range: 150-400 K/uL  225  Appearance Latest Ref Range: CLEAR  CLEAR   Bilirubin Urine Latest Ref Range: NEGATIVE  NEGATIVE   Color, Urine Latest Ref Range: YELLOW  YELLOW   Glucose Latest Ref Range: NEGATIVE mg/dL NEGATIVE   Hgb urine dipstick Latest Ref Range: NEGATIVE  TRACE (A)   Ketones, ur Latest Ref Range: NEGATIVE mg/dL NEGATIVE   Leukocytes, UA Latest Ref Range: NEGATIVE  NEGATIVE   Nitrite Latest Ref Range: NEGATIVE  NEGATIVE   pH Latest Ref Range: 5.0-8.0  7.0   Protein Latest Ref Range: NEGATIVE mg/dL NEGATIVE   RBC / HPF Latest Ref Range: <3 RBC/hpf 0-2    Specific Gravity, Urine Latest Ref Range: 1.005-1.030  1.015   Urobilinogen, UA Latest Ref Range: 0.0-1.0 mg/dL 0.2   WBC, UA Latest Ref Range: <3 WBC/hpf 0-2   Total Protein, Urine Latest Units: mg/dL 12   Protein Creatinine Ratio Latest Ref Range: 0.00-0.15 mg/mgCre 0.18 (H)   Creatinine, Urine Latest Units: mg/dL 67.00     Sono: vtx, AFI 18.8 cm, EFW 5-9 (77%),  Assessment and Plan  33.[redacted] weeks gestation Gestational HTN A2GDM Reactive NST  Discharge home Continue all previous meds PIH precautions Follow-up next week in office with Dr. Benjie Karvonen Dr.  Fogleman updated with A/P, agrees  Keirstin Musil, [place], N 08/30/2014, 7:15 PM

## 2014-08-30 NOTE — Discharge Instructions (Signed)

## 2014-08-30 NOTE — MAU Note (Signed)
Pt states took bp at home and was high. Has been elevated since second trimester. 150-170/100-110. Denies headache, however has been more short of breath. Increased swelling in hands and feet. No bleeding or abnormal vaginal discharge.

## 2014-09-22 ENCOUNTER — Other Ambulatory Visit: Payer: Self-pay | Admitting: Obstetrics & Gynecology

## 2014-09-24 NOTE — Patient Instructions (Signed)
Your procedure is scheduled on:  Wednesday, October 01, 2014  [facility]:  10:30 A.M.  Pick up the phone at the desk and dial [phone].  Call this number if you have problems the morning of surgery: [phone].  Remember: Do NOT eat food:  After midnight Tuesday, September 30, 2014 Do NOT drink clear liquids after:  After 8:00 a.m. Day of surgery Take these medicines the morning of surgery with a SIP OF WATER:  None  DO NOT TAKE METFORMIN 24 HOURS PRIOR TO SURGERY  Do NOT wear jewelry (body piercing), metal hair clips/bobby pins, or nail polish. Do NOT wear lotions, powders, or perfumes.  You may wear deoderant. Do NOT shave for 48 hours prior to surgery. Do NOT bring valuables to the hospital.  Leave suitcase in car.  After surgery it may be brought to your room.  For patients admitted to the hospital, checkout time is 11:00 AM the day of discharge.

## 2014-09-25 ENCOUNTER — Encounter (HOSPITAL_COMMUNITY)
Admission: RE | Admit: 2014-09-25 | Discharge: 2014-09-25 | Disposition: A | Discharge status: Home / Self Care | Payer: BLUE CROSS/BLUE SHIELD | Source: Ambulatory Visit | Attending: Obstetrics & Gynecology | Admitting: Obstetrics & Gynecology

## 2014-09-25 ENCOUNTER — Encounter (HOSPITAL_COMMUNITY): Payer: Self-pay

## 2014-09-25 DIAGNOSIS — Z01818 Encounter for other preprocedural examination: Secondary | ICD-10-CM | POA: Insufficient documentation

## 2014-09-25 HISTORY — DX: Polycystic ovarian syndrome: E28.2

## 2014-09-25 LAB — CBC
HCT: 36.5 % (ref 36.0–46.0)
Hemoglobin: 12.3 g/dL (ref 12.0–15.0)
MCH: 28.7 pg (ref 26.0–34.0)
MCHC: 33.7 g/dL (ref 30.0–36.0)
MCV: 85.3 fL (ref 78.0–100.0)
Platelets: 218 10*3/uL (ref 150–400)
RBC: 4.28 MIL/uL (ref 3.87–5.11)
RDW: 14.4 % (ref 11.5–15.5)
WBC: 11.7 10*3/uL — ABNORMAL HIGH (ref 4.0–10.5)

## 2014-09-25 LAB — TYPE AND SCREEN
ABO/RH(D): O POS
ANTIBODY SCREEN: NEGATIVE

## 2014-09-25 NOTE — Pre-Procedure Instructions (Signed)
Spoke with Dr. Jean Rosenthal in anesthesia and made him aware that patient is on Heparin and that she is having a C/S on 10/01/14.

## 2014-09-26 LAB — RPR: RPR Ser Ql: NONREACTIVE

## 2014-10-01 ENCOUNTER — Inpatient Hospital Stay (HOSPITAL_COMMUNITY): Payer: BLUE CROSS/BLUE SHIELD | Admitting: Anesthesiology

## 2014-10-01 ENCOUNTER — Encounter (HOSPITAL_COMMUNITY): Payer: Self-pay | Admitting: *Deleted

## 2014-10-01 ENCOUNTER — Encounter (HOSPITAL_COMMUNITY)
Admission: RE | Disposition: A | Discharge status: Home / Self Care | Payer: Self-pay | Source: Ambulatory Visit | Attending: Obstetrics & Gynecology

## 2014-10-01 ENCOUNTER — Inpatient Hospital Stay (HOSPITAL_COMMUNITY)
Admission: RE | Admit: 2014-10-01 | Discharge: 2014-10-03 | DRG: 765 | Disposition: A | Discharge status: Home / Self Care | Payer: BLUE CROSS/BLUE SHIELD | Source: Ambulatory Visit | Attending: Obstetrics & Gynecology | Admitting: Obstetrics & Gynecology

## 2014-10-01 DIAGNOSIS — O9081 Anemia of the puerperium: Secondary | ICD-10-CM | POA: Diagnosis not present

## 2014-10-01 DIAGNOSIS — O09523 Supervision of elderly multigravida, third trimester: Secondary | ICD-10-CM | POA: Diagnosis not present

## 2014-10-01 DIAGNOSIS — K219 Gastro-esophageal reflux disease without esophagitis: Secondary | ICD-10-CM | POA: Diagnosis present

## 2014-10-01 DIAGNOSIS — O99824 Streptococcus B carrier state complicating childbirth: Secondary | ICD-10-CM | POA: Diagnosis present

## 2014-10-01 DIAGNOSIS — D62 Acute posthemorrhagic anemia: Secondary | ICD-10-CM | POA: Diagnosis not present

## 2014-10-01 DIAGNOSIS — O09813 Supervision of pregnancy resulting from assisted reproductive technology, third trimester: Secondary | ICD-10-CM | POA: Diagnosis not present

## 2014-10-01 DIAGNOSIS — R21 Rash and other nonspecific skin eruption: Secondary | ICD-10-CM | POA: Diagnosis not present

## 2014-10-01 DIAGNOSIS — O99119 Other diseases of the blood and blood-forming organs and certain disorders involving the immune mechanism complicating pregnancy, unspecified trimester: Secondary | ICD-10-CM

## 2014-10-01 DIAGNOSIS — O3663X0 Maternal care for excessive fetal growth, third trimester, not applicable or unspecified: Secondary | ICD-10-CM | POA: Diagnosis present

## 2014-10-01 DIAGNOSIS — O9962 Diseases of the digestive system complicating childbirth: Secondary | ICD-10-CM | POA: Diagnosis present

## 2014-10-01 DIAGNOSIS — O99284 Endocrine, nutritional and metabolic diseases complicating childbirth: Secondary | ICD-10-CM | POA: Diagnosis present

## 2014-10-01 DIAGNOSIS — O3421 Maternal care for scar from previous cesarean delivery: Secondary | ICD-10-CM | POA: Diagnosis present

## 2014-10-01 DIAGNOSIS — O133 Gestational [pregnancy-induced] hypertension without significant proteinuria, third trimester: Secondary | ICD-10-CM | POA: Diagnosis present

## 2014-10-01 DIAGNOSIS — O321XX0 Maternal care for breech presentation, not applicable or unspecified: Principal | ICD-10-CM | POA: Diagnosis present

## 2014-10-01 DIAGNOSIS — Z3A38 38 weeks gestation of pregnancy: Secondary | ICD-10-CM | POA: Diagnosis present

## 2014-10-01 DIAGNOSIS — E282 Polycystic ovarian syndrome: Secondary | ICD-10-CM | POA: Diagnosis present

## 2014-10-01 DIAGNOSIS — O9912 Other diseases of the blood and blood-forming organs and certain disorders involving the immune mechanism complicating childbirth: Secondary | ICD-10-CM | POA: Diagnosis present

## 2014-10-01 DIAGNOSIS — D6859 Other primary thrombophilia: Secondary | ICD-10-CM

## 2014-10-01 LAB — PREPARE RBC (CROSSMATCH)

## 2014-10-01 LAB — CBC
HEMATOCRIT: 39.7 % (ref 36.0–46.0)
Hemoglobin: 13.4 g/dL (ref 12.0–15.0)
MCH: 28.9 pg (ref 26.0–34.0)
MCHC: 33.8 g/dL (ref 30.0–36.0)
MCV: 85.7 fL (ref 78.0–100.0)
Platelets: 217 10*3/uL (ref 150–400)
RBC: 4.63 MIL/uL (ref 3.87–5.11)
RDW: 14.4 % (ref 11.5–15.5)
WBC: 14.1 10*3/uL — AB (ref 4.0–10.5)

## 2014-10-01 LAB — HEMOGLOBIN AND HEMATOCRIT, BLOOD
HCT: 30.9 % — ABNORMAL LOW (ref 36.0–46.0)
Hemoglobin: 10.3 g/dL — ABNORMAL LOW (ref 12.0–15.0)

## 2014-10-01 SURGERY — Surgical Case
Anesthesia: Spinal

## 2014-10-01 MED ORDER — IBUPROFEN 600 MG PO TABS: 600.0000 mg | ORAL_TABLET | Freq: Four times a day (QID) | ORAL | Status: DC

## 2014-10-01 MED ORDER — DIPHENHYDRAMINE HCL 50 MG/ML IJ SOLN: 12.5000 mg | INTRAMUSCULAR | Status: DC | PRN

## 2014-10-01 MED ORDER — CEFAZOLIN SODIUM-DEXTROSE 2-3 GM-% IV SOLR
2.0000 g | INTRAVENOUS | Status: AC
  Administered 2014-10-01: 2 g via INTRAVENOUS

## 2014-10-01 MED ORDER — ONDANSETRON HCL 4 MG/2ML IJ SOLN
INTRAMUSCULAR | Status: DC | PRN
  Administered 2014-10-01: 4 mg via INTRAVENOUS

## 2014-10-01 MED ORDER — SCOPOLAMINE 1 MG/3DAYS TD PT72
MEDICATED_PATCH | TRANSDERMAL | Status: AC
  Administered 2014-10-01: 1.5 mg via TRANSDERMAL
  Filled 2014-10-01: qty 1

## 2014-10-01 MED ORDER — PHENYLEPHRINE HCL 10 MG/ML IJ SOLN
INTRAMUSCULAR | Status: DC | PRN
  Administered 2014-10-01: 80 ug via INTRAVENOUS

## 2014-10-01 MED ORDER — OXYTOCIN 40 UNITS IN LACTATED RINGERS INFUSION - SIMPLE MED: 62.5000 mL/h | INTRAVENOUS | Status: DC

## 2014-10-01 MED ORDER — FENTANYL CITRATE (PF) 100 MCG/2ML IJ SOLN
INTRAMUSCULAR | Status: DC | PRN
  Administered 2014-10-01: 25 ug via INTRATHECAL

## 2014-10-01 MED ORDER — DIBUCAINE 1 % RE OINT: 1.0000 "application " | TOPICAL_OINTMENT | RECTAL | Status: DC | PRN

## 2014-10-01 MED ORDER — LACTATED RINGERS IV SOLN
INTRAVENOUS | Status: DC
  Administered 2014-10-01: 22:00:00 via INTRAVENOUS

## 2014-10-01 MED ORDER — NALBUPHINE HCL 10 MG/ML IJ SOLN: 5.0000 mg | INTRAMUSCULAR | Status: DC | PRN

## 2014-10-01 MED ORDER — LACTATED RINGERS IV SOLN
Freq: Once | INTRAVENOUS | Status: AC
  Administered 2014-10-01: 11:00:00 via INTRAVENOUS

## 2014-10-01 MED ORDER — SODIUM CHLORIDE 0.9 % IJ SOLN
3.0000 mL | INTRAMUSCULAR | Status: DC | PRN
Start: 2014-10-01 — End: 2014-10-02

## 2014-10-01 MED ORDER — TETANUS-DIPHTH-ACELL PERTUSSIS 5-2.5-18.5 LF-MCG/0.5 IM SUSP
0.5000 mL | Freq: Once | INTRAMUSCULAR | Status: DC
Start: 2014-10-02 — End: 2014-10-02

## 2014-10-01 MED ORDER — LACTATED RINGERS IV SOLN
INTRAVENOUS | Status: DC | PRN
  Administered 2014-10-01 (×2): via INTRAVENOUS

## 2014-10-01 MED ORDER — ENOXAPARIN SODIUM 40 MG/0.4ML ~~LOC~~ SOLN
40.0000 mg | SUBCUTANEOUS | Status: DC
  Filled 2014-10-01: qty 0.4

## 2014-10-01 MED ORDER — OXYTOCIN 10 UNIT/ML IJ SOLN
40.0000 [IU] | INTRAVENOUS | Status: DC | PRN
  Administered 2014-10-01: 40 [IU] via INTRAVENOUS

## 2014-10-01 MED ORDER — SCOPOLAMINE 1 MG/3DAYS TD PT72
1.0000 | MEDICATED_PATCH | Freq: Once | TRANSDERMAL | Status: DC
  Administered 2014-10-01: 1.5 mg via TRANSDERMAL

## 2014-10-01 MED ORDER — 0.9 % SODIUM CHLORIDE (POUR BTL) OPTIME
TOPICAL | Status: DC | PRN
  Administered 2014-10-01: 1000 mL

## 2014-10-01 MED ORDER — DIPHENHYDRAMINE HCL 25 MG PO CAPS: 25.0000 mg | ORAL_CAPSULE | Freq: Four times a day (QID) | ORAL | Status: DC | PRN

## 2014-10-01 MED ORDER — SIMETHICONE 80 MG PO CHEW
80.0000 mg | CHEWABLE_TABLET | Freq: Three times a day (TID) | ORAL | Status: DC
  Administered 2014-10-01 – 2014-10-03 (×5): 80 mg via ORAL
  Filled 2014-10-01 (×5): qty 1

## 2014-10-01 MED ORDER — METFORMIN HCL 500 MG PO TABS: 250.0000 mg | ORAL_TABLET | Freq: Two times a day (BID) | ORAL | Status: DC

## 2014-10-01 MED ORDER — METFORMIN HCL 500 MG PO TABS
500.0000 mg | ORAL_TABLET | Freq: Every day | ORAL | Status: DC
  Administered 2014-10-02: 500 mg via ORAL
  Filled 2014-10-01: qty 1

## 2014-10-01 MED ORDER — WITCH HAZEL-GLYCERIN EX PADS: 1.0000 "application " | MEDICATED_PAD | CUTANEOUS | Status: DC | PRN

## 2014-10-01 MED ORDER — CEFAZOLIN SODIUM-DEXTROSE 2-3 GM-% IV SOLR
INTRAVENOUS | Status: AC
  Filled 2014-10-01: qty 50

## 2014-10-01 MED ORDER — EPHEDRINE SULFATE 50 MG/ML IJ SOLN
INTRAMUSCULAR | Status: DC | PRN
  Administered 2014-10-01 (×3): 10 mg via INTRAVENOUS

## 2014-10-01 MED ORDER — SIMETHICONE 80 MG PO CHEW: 80.0000 mg | CHEWABLE_TABLET | ORAL | Status: DC | PRN

## 2014-10-01 MED ORDER — PRENATAL MULTIVITAMIN CH
1.0000 | ORAL_TABLET | Freq: Every day | ORAL | Status: DC
  Administered 2014-10-02: 1 via ORAL
  Filled 2014-10-01: qty 1

## 2014-10-01 MED ORDER — ONDANSETRON HCL 4 MG/2ML IJ SOLN: 4.0000 mg | Freq: Three times a day (TID) | INTRAMUSCULAR | Status: DC | PRN

## 2014-10-01 MED ORDER — NALOXONE HCL 1 MG/ML IJ SOLN
1.0000 ug/kg/h | INTRAVENOUS | Status: DC | PRN
  Filled 2014-10-01: qty 2

## 2014-10-01 MED ORDER — METFORMIN HCL 500 MG PO TABS
250.0000 mg | ORAL_TABLET | Freq: Every day | ORAL | Status: DC
  Administered 2014-10-02 – 2014-10-03 (×2): 250 mg via ORAL
  Filled 2014-10-01 (×3): qty 1

## 2014-10-01 MED ORDER — PHENYLEPHRINE 8 MG IN D5W 100 ML (0.08MG/ML) PREMIX OPTIME
INJECTION | INTRAVENOUS | Status: DC | PRN
  Administered 2014-10-01: 60 ug/min via INTRAVENOUS

## 2014-10-01 MED ORDER — SENNOSIDES-DOCUSATE SODIUM 8.6-50 MG PO TABS
2.0000 | ORAL_TABLET | ORAL | Status: DC
  Administered 2014-10-02 (×2): 2 via ORAL
  Filled 2014-10-01 (×2): qty 2

## 2014-10-01 MED ORDER — MORPHINE SULFATE 0.5 MG/ML IJ SOLN
INTRAMUSCULAR | Status: AC
  Filled 2014-10-01: qty 10

## 2014-10-01 MED ORDER — ACETAMINOPHEN 500 MG PO TABS
1000.0000 mg | ORAL_TABLET | Freq: Four times a day (QID) | ORAL | Status: AC
  Administered 2014-10-01 – 2014-10-02 (×4): 1000 mg via ORAL
  Filled 2014-10-01 (×4): qty 2

## 2014-10-01 MED ORDER — OXYCODONE-ACETAMINOPHEN 5-325 MG PO TABS: 1.0000 | ORAL_TABLET | ORAL | Status: DC | PRN

## 2014-10-01 MED ORDER — NALBUPHINE HCL 10 MG/ML IJ SOLN: 5.0000 mg | Freq: Once | INTRAMUSCULAR | Status: AC | PRN

## 2014-10-01 MED ORDER — DOCUSATE SODIUM 100 MG PO CAPS
200.0000 mg | ORAL_CAPSULE | Freq: Every day | ORAL | Status: DC
  Administered 2014-10-02 – 2014-10-03 (×2): 200 mg via ORAL
  Filled 2014-10-01 (×3): qty 2

## 2014-10-01 MED ORDER — ENOXAPARIN SODIUM 40 MG/0.4ML ~~LOC~~ SOLN: 40.0000 mg | SUBCUTANEOUS | Status: DC

## 2014-10-01 MED ORDER — MAGNESIUM HYDROXIDE 400 MG/5ML PO SUSP
30.0000 mL | Freq: Every day | ORAL | Status: DC | PRN
  Administered 2014-10-01: 30 mL via ORAL
  Filled 2014-10-01: qty 30

## 2014-10-01 MED ORDER — DIPHENHYDRAMINE HCL 25 MG PO CAPS
25.0000 mg | ORAL_CAPSULE | ORAL | Status: DC | PRN
Start: 2014-10-01 — End: 2014-10-02

## 2014-10-01 MED ORDER — LANOLIN HYDROUS EX OINT: 1.0000 "application " | TOPICAL_OINTMENT | CUTANEOUS | Status: DC | PRN

## 2014-10-01 MED ORDER — ZOLPIDEM TARTRATE 5 MG PO TABS: 5.0000 mg | ORAL_TABLET | Freq: Every evening | ORAL | Status: DC | PRN

## 2014-10-01 MED ORDER — NALOXONE HCL 0.4 MG/ML IJ SOLN: 0.4000 mg | INTRAMUSCULAR | Status: DC | PRN

## 2014-10-01 MED ORDER — ONDANSETRON HCL 4 MG/2ML IJ SOLN
INTRAMUSCULAR | Status: AC
  Filled 2014-10-01: qty 2

## 2014-10-01 MED ORDER — OXYCODONE-ACETAMINOPHEN 5-325 MG PO TABS: 2.0000 | ORAL_TABLET | ORAL | Status: DC | PRN

## 2014-10-01 MED ORDER — MEPERIDINE HCL 25 MG/ML IJ SOLN: 6.2500 mg | INTRAMUSCULAR | Status: DC | PRN

## 2014-10-01 MED ORDER — HYDROMORPHONE HCL 1 MG/ML IJ SOLN
0.2500 mg | INTRAMUSCULAR | Status: DC | PRN
  Administered 2014-10-01 (×2): 0.5 mg via INTRAVENOUS

## 2014-10-01 MED ORDER — ACETAMINOPHEN 325 MG PO TABS
650.0000 mg | ORAL_TABLET | ORAL | Status: DC | PRN
  Administered 2014-10-02: 325 mg via ORAL
  Administered 2014-10-02 – 2014-10-03 (×2): 650 mg via ORAL
  Filled 2014-10-01 (×3): qty 2

## 2014-10-01 MED ORDER — MORPHINE SULFATE (PF) 0.5 MG/ML IJ SOLN
INTRAMUSCULAR | Status: DC | PRN
  Administered 2014-10-01: .1 mg via INTRATHECAL

## 2014-10-01 MED ORDER — MENTHOL 3 MG MT LOZG: 1.0000 | LOZENGE | OROMUCOSAL | Status: DC | PRN

## 2014-10-01 MED ORDER — OXYTOCIN 10 UNIT/ML IJ SOLN
INTRAMUSCULAR | Status: AC
  Filled 2014-10-01: qty 4

## 2014-10-01 MED ORDER — SIMETHICONE 80 MG PO CHEW
80.0000 mg | CHEWABLE_TABLET | ORAL | Status: DC
  Administered 2014-10-02 (×3): 80 mg via ORAL
  Filled 2014-10-01 (×3): qty 1

## 2014-10-01 MED ORDER — HYDROMORPHONE HCL 1 MG/ML IJ SOLN
INTRAMUSCULAR | Status: AC
  Administered 2014-10-01: 0.5 mg via INTRAVENOUS
  Filled 2014-10-01: qty 1

## 2014-10-01 MED ORDER — SCOPOLAMINE 1 MG/3DAYS TD PT72
1.0000 | MEDICATED_PATCH | Freq: Once | TRANSDERMAL | Status: DC
  Filled 2014-10-01: qty 1

## 2014-10-01 MED ORDER — FENTANYL CITRATE (PF) 100 MCG/2ML IJ SOLN
INTRAMUSCULAR | Status: AC
  Filled 2014-10-01: qty 2

## 2014-10-01 SURGICAL SUPPLY — 38 items
BENZOIN TINCTURE PRP APPL 2/3 (GAUZE/BANDAGES/DRESSINGS) ×2 IMPLANT
CLAMP CORD UMBIL (MISCELLANEOUS) IMPLANT
CLOTH BEACON ORANGE TIMEOUT ST (SAFETY) ×2 IMPLANT
CONTAINER PREFILL 10% NBF 15ML (MISCELLANEOUS) IMPLANT
DRAPE SHEET LG 3/4 BI-LAMINATE (DRAPES) IMPLANT
DRSG OPSITE POSTOP 4X10 (GAUZE/BANDAGES/DRESSINGS) ×2 IMPLANT
DRSG TELFA 3X8 NADH (GAUZE/BANDAGES/DRESSINGS) ×2 IMPLANT
DURAPREP 26ML APPLICATOR (WOUND CARE) ×2 IMPLANT
ELECT REM PT RETURN 9FT ADLT (ELECTROSURGICAL) ×2
ELECTRODE REM PT RTRN 9FT ADLT (ELECTROSURGICAL) ×1 IMPLANT
EXTRACTOR VACUUM KIWI (MISCELLANEOUS) IMPLANT
EXTRACTOR VACUUM M CUP 4 TUBE (SUCTIONS) IMPLANT
GLOVE BIO SURGEON STRL SZ7 (GLOVE) ×2 IMPLANT
GLOVE BIOGEL PI IND STRL 7.0 (GLOVE) ×1 IMPLANT
GLOVE BIOGEL PI INDICATOR 7.0 (GLOVE) ×1
GOWN STRL REUS W/TWL LRG LVL3 (GOWN DISPOSABLE) ×4 IMPLANT
KIT ABG SYR 3ML LUER SLIP (SYRINGE) IMPLANT
NEEDLE HYPO 25X5/8 SAFETYGLIDE (NEEDLE) IMPLANT
NS IRRIG 1000ML POUR BTL (IV SOLUTION) ×2 IMPLANT
PACK C SECTION WH (CUSTOM PROCEDURE TRAY) ×2 IMPLANT
PAD OB MATERNITY 4.3X12.25 (PERSONAL CARE ITEMS) ×2 IMPLANT
RTRCTR C-SECT PINK 25CM LRG (MISCELLANEOUS) IMPLANT
SPONGE GAUZE 4X4 12PLY STER LF (GAUZE/BANDAGES/DRESSINGS) ×2 IMPLANT
STAPLER VISISTAT 35W (STAPLE) IMPLANT
STRIP CLOSURE SKIN 1/2X4 (GAUZE/BANDAGES/DRESSINGS) ×2 IMPLANT
SUT MON AB-0 CT1 36 (SUTURE) ×6 IMPLANT
SUT PLAIN 0 NONE (SUTURE) IMPLANT
SUT PLAIN 2 0 (SUTURE)
SUT PLAIN ABS 2-0 CT1 27XMFL (SUTURE) IMPLANT
SUT VIC AB 0 CT1 27 (SUTURE) ×4
SUT VIC AB 0 CT1 27XBRD ANBCTR (SUTURE) ×2 IMPLANT
SUT VIC AB 2-0 CT1 27 (SUTURE) ×6
SUT VIC AB 2-0 CT1 TAPERPNT 27 (SUTURE) ×3 IMPLANT
SUT VIC AB 4-0 KS 27 (SUTURE) IMPLANT
SUT VICRYL 0 TIES 12 18 (SUTURE) IMPLANT
TAPE CLOTH SURG 4X10 WHT LF (GAUZE/BANDAGES/DRESSINGS) ×2 IMPLANT
TOWEL OR 17X24 6PK STRL BLUE (TOWEL DISPOSABLE) ×2 IMPLANT
TRAY FOLEY CATH SILVER 14FR (SET/KITS/TRAYS/PACK) IMPLANT

## 2014-10-01 NOTE — Op Note (Signed)
Cesarean Section Procedure Note KANANI ERNEY 10/01/2014  Indications: Breech Presentation, gestational HTN, prior C/section, IVF pregnancy.   Pre-operative Diagnosis: Breech, Gestational Hypertension, Previous Cesarean Section.   Post-operative Diagnosis: Same   Surgeon: Shea Evans, MD  Assistants: Raelyn Mora, CNM   Anesthesia: spinal   Procedure Details:  The patient was seen in the Holding Room. The risks, benefits, complications, treatment options, and expected outcomes were discussed with the patient. The patient concurred with the proposed plan, giving informed consent. identified as Leah Moreno and the procedure verified as C-Section Delivery. A Time Out was held and the above information confirmed. 2 gm Ancef given.  After induction of anesthesia, the patient was draped and prepped in the usual sterile manner. A Pfannenstiel Incision was made and carried down through the subcutaneous tissue to the fascia. Fascial incision was made and extended transversely. The fascia was separated from the underlying rectus tissue superiorly and inferiorly. The peritoneum was identified and entered. Peritoneal incision was extended longitudinally. The utero-vesical peritoneal reflection was incised transversely and the bladder flap was bluntly freed from the lower uterine segment. Alexis retractor was placed. A low transverse uterine incision was made. Clear copious amniotic fluid was drained. Delivered from [facility] breech presentation was a healthy FEMALE infant at 11.50 am with Apgar scores of 9 at one minute and 9 at five minutes. Weight 8 lbs and 2 oz. Cord clamped and cut. Cord ph was not sent. The placenta was removed partially torn since it was adherent firmly and needed manual removal.  and was sent to pathology after cord blood collection. The uterine outline had bleeding vessels from varicosities in lower segment. Lower segment was not smooth when evaluated from inside. Cervical canal was  palpated and normal but closed, it was opened with Nicholaus Bloom clamp. Profuse bleeding was controlled with uterine massage and placing Ring clamps on varicosities. Both the tubes and ovaries appeared normal. The uterine incision was closed with running locked sutures of followed by a second imbricating layer.  Hemostasis was observed.  Alexis retractor was removed. Peritoneal layer closed with 2-0 Vicryl. Muscles approximated in lower area. The fascia was then reapproximated with running sutures of 0Vicryl. The subcuticular closure was performed using 2-0plain gut. The skin was closed with 4-0Vicryl. Steristrips applied.   Instrument, sponge, and needle counts were correct prior the abdominal closure and were correct at the conclusion of the case.   Findings: Female infant delivered via Kerr hysterotomy at 11.50 am from Minto breech position, Apgars 9 and 9. Copious bleeding from lower segment varicosities and lower segment rough/ ratty endometrial surface but was well controlled before hysterotomy closure. Normal tubes and ovaries.    Estimated Blood Loss: 2000 cc   Total IV Fluids: 3500 ml LR   Urine Output: 150 cc clear in foley   Specimens: Cord blood, cord blood donation, placenta    Complications: Excessive blood loss   Disposition: PACU - hemodynamically stable.   Maternal Condition: stable   Baby condition / location:  Couplet care / Skin to Skin  Attending Attestation: I performed the procedure.   Signed: Surgeon(s): Shea Evans, MD

## 2014-10-01 NOTE — Anesthesia Preprocedure Evaluation (Signed)
Anesthesia Evaluation  Patient identified by MRN, date of birth, ID band Patient awake    Reviewed: Allergy & Precautions, H&P , NPO status , Patient's Chart, lab work & pertinent test results, reviewed documented beta blocker date and time   History of Anesthesia Complications Negative for: history of anesthetic complications  Airway Mallampati: I  TM Distance: >3 FB Neck ROM: full    Dental no notable dental hx. (+) Teeth Intact   Pulmonary neg pulmonary ROS,  breath sounds clear to auscultation  Pulmonary exam normal       Cardiovascular Exercise Tolerance: Good hypertension, Rhythm:regular Rate:Normal     Neuro/Psych negative neurological ROS  negative psych ROS   GI/Hepatic Neg liver ROS, GERD-  Medicated,  Endo/Other  Morbid obesityPCOS - on metformin  Renal/GU negative Renal ROS  negative genitourinary   Musculoskeletal   Abdominal   Peds  Hematology negative hematology ROS (+)   Anesthesia Other Findings Naproxen caused stomach problems, takes aspirin and ibuprofen regularly without problems  Reproductive/Obstetrics (+) Pregnancy (twins)                             Anesthesia Physical  Anesthesia Plan  ASA: III  Anesthesia Plan: Spinal   Post-op Pain Management:    Induction:   Airway Management Planned:   Additional Equipment:   Intra-op Plan:   Post-operative Plan:   Informed Consent: I have reviewed the patients History and Physical, chart, labs and discussed the procedure including the risks, benefits and alternatives for the proposed anesthesia with the patient or authorized representative who has indicated his/her understanding and acceptance.   Dental Advisory Given  Plan Discussed with: CRNA and Surgeon  Anesthesia Plan Comments:         Anesthesia Quick Evaluation

## 2014-10-01 NOTE — Transfer of Care (Signed)
Immediate Anesthesia Transfer of Care Note  Patient: Leah Moreno  Procedure(s) Performed: Procedure(s) with comments: Repeat CESAREAN SECTION (N/A) - EDD: 10/12/14   Patient Location: PACU  Anesthesia Type:Spinal  Level of Consciousness: awake, alert  and oriented  Airway & Oxygen Therapy: Patient Spontanous Breathing  Post-op Assessment: Report given to RN and Post -op Vital signs reviewed and stable  Post vital signs: Reviewed and stable  Last Vitals:  Filed Vitals:   10/01/14 1046  BP: 143/87  Pulse: 106  Temp: 36.9 C  Resp: 18    Complications: No apparent anesthesia complications

## 2014-10-01 NOTE — Anesthesia Procedure Notes (Signed)

## 2014-10-01 NOTE — H&P (Signed)
Leah Moreno is a 34 y.o. female presenting for repeat C/section at 38.3 wks due to Gestational HTN, recently more elevated and better only with additional rest but not needed medication.  Patient has prior C/section and baby is in Breech position. She has untested pelvic with c/section scar and is on prophylactic anticoagulation, so after counseling, external version was deferred.  [facility]- Dr Zuhair Lariccia/ [facility]. IVF pregnancy with frozen embryo transfer (Dr April Manson).   Protein S def, on Lovenox 40 units daily and switched to Heparin 5000 units bid from 36 wks until last dose last night at 9 pm.  Gestational HTN Hx, recurrence in this pregnancy from 22 wks, not needing medication but needed evaluation in MAU at 33 wks, PIH labs were normal. Growth sono every 4 wks from 24 wks and ANtesting with NST/BPP reactive. LGA suspected.  PCOS hx, insulin resistance, needing to continue Metformin  in AM and 500 mg in PM.  History OB History    Gravida Para Term Preterm AB TAB SAB Ectopic Multiple Living   Past Medical History  Diagnosis Date  . Blood dyscrasia     Protein S deficient, prothrombing gene factor 2  . Insulin resistance     takes Metformin- has PCOS  . Heartburn in pregnancy   . Pregnancy induced hypertension 2013  . PP care - s/p 1C/S 4/18 (twins) 08/05/2011  . Protein S deficiency complicating pregnancy - delivered 08/05/2011  . Borderline hypertension 08/05/2011  . Thrombocytopenia due to blood loss 08/05/2011  . Thrombocytopenia due to blood loss 08/05/2011  . PCOS (polycystic ovarian syndrome)    Past Surgical History  Procedure Laterality Date  . Ivf      egg retrieval  . Cesarean section  08/04/2011    Procedure: CESAREAN SECTION;  Surgeon: Robley Fries, MD;  Location: [facility];  Service: Gynecology;  Laterality: N/A;  Twins  EDD: 08/19/11   Family History: family history is not on file. Social History:  reports that she has never smoked. She has  never used smokeless tobacco. She reports that she does not drink alcohol or use illicit drugs.   Prenatal Transfer Tool  Maternal Diabetes: Yes:  Diabetes Type:  Insulin/Medication controlled  Metformin for PCOS and insulin resistance Genetic Screening: Declined Maternal Ultrasounds/Referrals: Normal Fetal Ultrasounds or other Referrals:  None Maternal Substance Abuse:  No Significant Maternal Medications:  Meds include: Other:  Hormone replacement with IVF preg in 1st trimester, Pepcid for GERD, Lovenox prophylaxis/ switched to Heparin 5000 units bid. Metformin Significant Maternal Lab Results:  Lab values include: Group B Strep positive Other Comments:  None  ROS no HA/ vision changes/ RUQ pain/ swelling etc    Blood pressure 143/87, pulse 106, temperature 98.4 F (36.9 C), temperature source Oral, resp. rate 18, SpO2 99 %. Exam Physical Exam   A&O x 3, no acute distress. Pleasant HEENT neg, no thyromegaly Lungs CTA bilat CV RRR, S1S2 normal Abdo soft, non tender, non acute- gravid, BREECH persists Extr no edema/ tenderness Pelvic deferred FHT  140s Toco none   Prenatal labs: ABO, Rh: --/--/O POS (06/09 1325) Antibody: NEG (06/09 1325) Rubella: Immune, Immune (12/15 0000) RPR: Non Reactive (06/09 1325)  HBsAg: Negative (12/15 0000)  HIV: Non-reactive (12/01 0000)  GBS:   positive   Assessment/Plan: 34 yo, G2P1, 2nd IVF pregnancy, Breech, gestational HTN, prior C/s x1, here for repeat C/s.   Risks/complications of surgery  reviewed incl infection, bleeding, damage to internal organs including bladder, bowels, ureters, blood vessels, other risks from anesthesia, VTE and delayed complications of any surgery, complications in future surgery reviewed. Also discussed neonatal complications incl difficult delivery, laceration, vacuum assistance, TTN etc. Pt understands and agrees, all concerns addressed.       [name] R 10/01/2014, 10:58 AM

## 2014-10-01 NOTE — Consult Note (Signed)
Neonatology Note:   Attendance at C-section:    I was asked by Dr. Juliene Pina to attend this repeat C/S at term. The mother is a G2P1 O pos, GBS pos with gestational HTN, DM (Metformin), PCOS, IVF pregnancy, Protein S deficiency (on Lovenox and Heparin), and breech presentation. ROM at delivery, fluid clear. Delivered footling breech.  Infant vigorous with good spontaneous cry and tone. Needed only minimal bulb suctioning. Ap 9/9. Lungs clear to ausc in DR. To CN to care of Pediatrician.   Doretha Sou, MD

## 2014-10-02 ENCOUNTER — Encounter (HOSPITAL_COMMUNITY): Payer: Self-pay | Admitting: Obstetrics & Gynecology

## 2014-10-02 LAB — CBC
HCT: 28.2 % — ABNORMAL LOW (ref 36.0–46.0)
Hemoglobin: 9.4 g/dL — ABNORMAL LOW (ref 12.0–15.0)
MCH: 28.6 pg (ref 26.0–34.0)
MCHC: 33.3 g/dL (ref 30.0–36.0)
MCV: 85.7 fL (ref 78.0–100.0)
Platelets: 195 10*3/uL (ref 150–400)
RBC: 3.29 MIL/uL — ABNORMAL LOW (ref 3.87–5.11)
RDW: 14.4 % (ref 11.5–15.5)
WBC: 11.7 10*3/uL — ABNORMAL HIGH (ref 4.0–10.5)

## 2014-10-02 MED ORDER — ENOXAPARIN SODIUM 40 MG/0.4ML ~~LOC~~ SOLN
40.0000 mg | SUBCUTANEOUS | Status: DC
  Filled 2014-10-02: qty 0.4

## 2014-10-02 MED ORDER — HYDROCORTISONE 1 % EX CREA
TOPICAL_CREAM | Freq: Four times a day (QID) | CUTANEOUS | Status: DC
  Administered 2014-10-02 – 2014-10-03 (×2): via TOPICAL
  Filled 2014-10-02: qty 28

## 2014-10-02 MED ORDER — POLYETHYLENE GLYCOL 3350 17 G PO PACK
17.0000 g | PACK | Freq: Every day | ORAL | Status: DC
  Administered 2014-10-02 – 2014-10-03 (×2): 17 g via ORAL
  Filled 2014-10-02 (×2): qty 1

## 2014-10-02 MED ORDER — OXYCODONE HCL 5 MG PO TABS
5.0000 mg | ORAL_TABLET | ORAL | Status: DC | PRN
  Administered 2014-10-02 – 2014-10-03 (×2): 5 mg via ORAL
  Filled 2014-10-02 (×2): qty 1

## 2014-10-02 NOTE — Lactation Note (Signed)
This note was copied from the chart of Leah Brianca Schaus. Lactation Consultation Note Experienced BF mom didn't have success BF her twins that are 3 yrs. Old d/t PCOS and very low milk supply.  Has tubular breast w/everted nipples. Baby is cluster feeding and latching well. Assisted mom in adding a pillow d/t she was leaning over to baby instead of bringing baby to her. Mom hand expressed colostrum. Baby having good output. Encouraged to monitor that. Mom wanted to know at what point would we know if she needed to supplement. I explained w/decrease in out put and baby acting not satisfied, and increased weight loss. Mom encouraged to feed baby 8-12 times/24 hours and with feeding cues. Mom encouraged to waken baby for feeds. Educated about newborn behavior. Explained supply and demand and increasing milk supply by post pumping after BF baby and what she pumps she would give to baby as supplement. Also encouraged hand expression d/t colostrum being so thick.  Mom shown how to use DEBP & how to disassemble, clean, & reassemble parts.Mom knows to pump q3h for 15-20 min.Referred to Baby and Me Book in Breastfeeding section Pg. 22-23 for position options and Proper latch demonstration.[facility] brochure given w/resources, support groups and LC services. Mom encouraged to do skin-to-skin.  Patient Name: Leah Moreno [id number] Date: 10/02/2014 Reason for consult: Initial assessment   Maternal Data Has patient been taught Hand Expression?: Yes Does the patient have breastfeeding experience prior to this delivery?: Yes  Feeding Feeding Type: Breast Fed Length of feed: 20 min  LATCH Score/Interventions Latch: Grasps breast easily, tongue down, lips flanged, rhythmical sucking. Intervention(s): Breast compression;Breast massage;Adjust position  Audible Swallowing: A few with stimulation Intervention(s): Skin to skin;Hand expression  Type of Nipple: Everted at rest and after stimulation  Comfort  (Breast/Nipple): Soft / non-tender     Hold (Positioning): Assistance needed to correctly position infant at breast and maintain latch. Intervention(s): Breastfeeding basics reviewed;Support Pillows;Position options;Skin to skin  LATCH Score: 8  Lactation Tools Discussed/Used Tools: Pump Breast pump type: Double-Electric Breast Pump Pump Review: Setup, frequency, and cleaning;Milk Storage Initiated by:: Peri Jefferson RN Date initiated:: 10/02/14   Consult Status Consult Status: Follow-up Date: 10/03/14 Follow-up type: In-patient    Leah Moreno, Diamond Nickel 10/02/2014, 4:09 AM

## 2014-10-02 NOTE — Lactation Note (Signed)
This note was copied from the chart of Leah Moreno. Lactation Consultation Note  Patient Name: Leah Moreno [id number] Date: 10/02/2014 Reason for consult: Follow-up assessment   Follow-up at 30 hours old.  Mom has history of PCOS and decreased milk supply with first pregnancy of twins; DM on Metformin; and has Protein S deficiency on Lovenox & Heparin. Infant has breastfed x7 (10-20 min) + attempts x2 ( ) in past 24 hours; voids-6 in past 24 hours/ 7 life; stools-4 in 24 hrs and life.  Infant was circumcised today. At last nights weight check infant had a 4% weight loss in 24 hours but had 4 voids and 2 stools prior to weight check.  Parents stated infant is satisfied after feeding and goes to sleep. LC asked mom to demonstrate hand expression with drops of colostrum visible.  Mom woke infant up and latched infant. Minimal assistance with positioning from LC using cross-cradle on left breast.  Taught asymmetrical latching and sandwiching of breast and flanging of bottom lip. Several swallows heard; LS-8.  Infant sucked with a deep rhythmical sucking pattern.   Mom has DEBP in room and has pumped at least once today.  Encouraged mom to pump during the day between feedings for extra stimulation d/t history of PCOS.   Encouraged hand expression and collecting for EBM supplementation with spoon or using curved-tip syringe at breast.   Mom c/o sore nipples.  Comfort gels given and explained how to use.   Discussed weight loss with mom and mom's history and potential need for formula (if colostrum not available) depending on tonight's weight check.   Parents would like to continue exclusively breastfeeding throughout the night and then have weight re-checked in am before deciding to supplement with formula. Educated on cluster feeding behaviors.  Mom stated infant did some cluster feeding last night.   Discussed consult and plan of care with RN.     Maternal Data Formula Feeding for  Exclusion: No Has patient been taught Hand Expression?: Yes  Feeding Feeding Type: Breast Fed Length of feed: 30 min  LATCH Score/Interventions Latch: Grasps breast easily, tongue down, lips flanged, rhythmical sucking. Intervention(s): Adjust position;Assist with latch;Breast compression  Audible Swallowing: A few with stimulation Intervention(s): Hand expression  Type of Nipple: Everted at rest and after stimulation  Comfort (Breast/Nipple): Soft / non-tender     Hold (Positioning): Assistance needed to correctly position infant at breast and maintain latch. Intervention(s): Breastfeeding basics reviewed;Support Pillows;Position options;Skin to skin  LATCH Score: 8  Lactation Tools Discussed/Used     Consult Status Consult Status: Follow-up Date: 10/03/14 Follow-up type: In-patient    Leah Moreno 10/02/2014, 6:49 PM

## 2014-10-02 NOTE — Anesthesia Postprocedure Evaluation (Signed)
  Anesthesia Post-op Note  Patient: Leah Moreno  Procedure(s) Performed: Procedure(s) with comments: Repeat CESAREAN SECTION (N/A) - EDD: 10/12/14   Patient Location: Mother/Baby  Anesthesia Type:Spinal  Level of Consciousness: awake, alert , oriented and patient cooperative  Airway and Oxygen Therapy: Patient Spontanous Breathing  Post-op Pain: none  Post-op Assessment: Post-op Vital signs reviewed, Patient's Cardiovascular Status Stable, Respiratory Function Stable, Patent Airway, No headache, No backache and Patient able to bend at knees   Post-op Vital Signs: Reviewed and stable  Last Vitals:  Filed Vitals:   10/02/14 0400  BP: 122/55  Pulse: 78  Temp: 36.8 C  Resp: 20    Complications: No apparent anesthesia complications

## 2014-10-02 NOTE — Progress Notes (Signed)
Hospital day #2  Subjective: Patient reports up out of bed without dizziness or syncope. Patient notes normal void, normal appetite, pain overall well controlled. Patient does note some bruising around her incision and also notes some itching. Of note patient states she is very sensitive to lotions and needed hydrocortisone around her incision after her last cesarean section. Patient expresses concern over low milk supply she had in her last pregnancy.  Past medical history reviewed: Protein S deficiency without personal history of clotting, polycystic ovarian syndrome  Objective:  Filed Vitals:   10/01/14 2000 10/02/14 0010 10/02/14 0400 10/02/14 0800  BP: 122/60 122/60 122/55 112/55  Pulse: 78 70 78 60  Temp: 98.7 F (37.1 C) 98.7 F (37.1 C) 98.2 F (36.8 C) 98 F (36.7 C)  TempSrc: Oral Oral Oral Oral  Resp: 20 20 20 20   SpO2: 96% 97% 97% 98%   Gen.: Well-appearing, no distress Abdomen: Soft, a properly tender, nondistended, fundus below the umbilicus with appropriate tenderness Incision: Dressed, some bruising superior to the incision Skin some small patches of light pink skin irritation GU: Deferred Lower extremities: SCDs in place, 1+ edema, nontender  CBC    Component Value Date/Time   WBC 11.7* 10/02/2014 0550   RBC 3.29* 10/02/2014 0550   HGB 9.4* 10/02/2014 0550   HCT 28.2* 10/02/2014 0550   PLT 195 10/02/2014 0550   MCV 85.7 10/02/2014 0550   MCH 28.6 10/02/2014 0550   MCHC 33.3 10/02/2014 0550   RDW 14.4 10/02/2014 0550    Assessment and plan: 34 year old post operative day #1 status post repeat cesarean section with 2 L blood loss : Acute blood loss anemia. Patient asymptomatic and hemoglobin with appropriate drop from postop/ intraoperative CBC yesterday no continued bleeding. Do not expect further drop. No need to continue checking CBCs as long as patient remains asymptomatic. We'll start iron as an outpatient once bowel function has resumed : Polycystic  ovarian syndrome. Patient notes doing better on metformin and would like to resume. We also discussed that this may help improve milk supply. Will start on metformin 500 twice a day but patient will likely titrate this at home on her own : Protein S deficiency. Given relative stability of CBC and no further bleeding will resume prophylactic Lovenox and will continue this for 6 weeks postpartum : Skin rash hydrocortisone : Naprosyn allergy, will treat pain with oxycodone alone : Consented for circumcision  Jillene Wehrenberg A. 10/02/2014 11:06 AM

## 2014-10-03 LAB — RPR: RPR Ser Ql: NONREACTIVE

## 2014-10-03 LAB — TYPE AND SCREEN
ABO/RH(D): O POS
ANTIBODY SCREEN: NEGATIVE
Unit division: 0
Unit division: 0

## 2014-10-03 MED ORDER — ACETAMINOPHEN 325 MG PO TABS: 650.0000 mg | ORAL_TABLET | ORAL | Status: DC | PRN

## 2014-10-03 MED ORDER — MAGNESIUM OXIDE 400 (241.3 MG) MG PO TABS
400.0000 mg | ORAL_TABLET | Freq: Every day | ORAL | Status: DC
  Administered 2014-10-03: 400 mg via ORAL
  Filled 2014-10-03: qty 1

## 2014-10-03 MED ORDER — METFORMIN HCL 500 MG PO TABS: 500.0000 mg | ORAL_TABLET | Freq: Two times a day (BID) | ORAL | Status: DC

## 2014-10-03 MED ORDER — MAGNESIUM OXIDE 400 (241.3 MG) MG PO TABS
400.0000 mg | ORAL_TABLET | Freq: Every day | ORAL | Status: DC
Start: 2014-10-03 — End: 2016-08-01

## 2014-10-03 MED ORDER — OXYCODONE HCL 5 MG PO TABS: 5.0000 mg | ORAL_TABLET | ORAL | Status: DC | PRN

## 2014-10-03 NOTE — Discharge Summary (Signed)
POSTOPERATIVE DISCHARGE SUMMARY:  Patient ID: Leah Moreno MRN: [id number] DOB/AGE: [birth year] 34 y.o.  Admit date: 10/01/2014 Admission Diagnoses: 38.3 weeks / previous cesarean section / protein S deficiency / breech / gestational hypertension  Discharge date:  10/03/2014 Discharge Diagnoses: POD 2 s/p cesarean section / protein S deficiency - Lovenox prophylaxis   Prenatal history: [id number]   EDC : 10/12/2014, Alternate EDD Entry  Prenatal care at [facility]  Primary provider : Mody Prenatal course complicated by AMA / IVF / PCOS / protein deficiency / gestational hypertension / constipation  Prenatal Labs: ABO, Rh: --/--/O POS (06/15 1020) Antibody: NEG (06/15 1020) Rubella: Immune, Immune (12/15 0000)   RPR: Non Reactive (06/09 1325)  HBsAg: Negative (12/15 0000)  HIV: Non-reactive (12/01 0000)  GTT : ABNORMAL   Medical / Surgical History :  Past medical history:  Past Medical History  Diagnosis Date  . Blood dyscrasia     Protein S deficient, prothrombing gene factor 2  . Insulin resistance     takes Metformin- has PCOS  . Heartburn in pregnancy   . Pregnancy induced hypertension 2013  . PP care - s/p 1C/S 4/18 (twins) 08/05/2011  . Protein S deficiency complicating pregnancy - delivered 08/05/2011  . Borderline hypertension 08/05/2011  . Thrombocytopenia due to blood loss 08/05/2011  . Thrombocytopenia due to blood loss 08/05/2011  . PCOS (polycystic ovarian syndrome)   . Postpartum care following cesarean delivery (6/15) 10/01/2014    Past surgical history:  Past Surgical History  Procedure Laterality Date  . Ivf      egg retrieval  . Cesarean section  08/04/2011    Procedure: CESAREAN SECTION;  Surgeon: Robley Fries, MD;  Location: [facility];  Service: Gynecology;  Laterality: N/A;  Twins  EDD: 08/19/11  . Cesarean section N/A 10/01/2014    Procedure: Repeat CESAREAN SECTION;  Surgeon: Shea Evans, MD;  Location: [facility];  Service: Obstetrics;   Laterality: N/A;  EDD: 10/12/14     Family History: History reviewed. No pertinent family history.  Social History:  reports that she has never smoked. She has never used smokeless tobacco. She reports that she does not drink alcohol or use illicit drugs.  Allergies: Naproxen   Current Medications at time of admission:  Prior to Admission medications   Medication Sig Start Date End Date Taking? Authorizing Provider  aspirin 81 MG tablet Take 81 mg by mouth daily.   Yes Historical Provider, MD  diphenhydrAMINE (BENADRYL) 25 MG tablet Take 37.5 mg by mouth at bedtime as needed for sleep.   Yes Historical Provider, MD  docusate sodium (COLACE) 100 MG capsule Take 200 mg by mouth daily.   Yes Historical Provider, MD  heparin 5000 UNIT/ML injection Inject 5,000 Units into the skin 2 (two) times daily.   Yes Historical Provider, MD  loratadine (CLARITIN) 10 MG tablet Take 10 mg by mouth daily.   Yes Historical Provider, MD  polyethylene glycol (MIRALAX / GLYCOLAX) packet Take 17 g by mouth daily as needed for mild constipation.   Yes Historical Provider, MD  Prenatal Vit-Fe Fumarate-FA (PRENATAL MULTIVITAMIN) TABS Take 1 tablet by mouth daily.   Yes Historical Provider, MD  Probiotic Product (ALIGN PO) Take 1 capsule by mouth daily.   Yes Historical Provider, MD  acetaminophen (TYLENOL) 325 MG tablet Take 2 tablets (650 mg total) by mouth every 4 (four) hours as needed (for pain scale < 4). 10/03/14   Marlinda Mike, CNM  enoxaparin (LOVENOX) 40  MG/0.4ML injection Inject 0.4 mLs (40 mg total) into the skin daily. For protein S deficiency and rare prothrombin gene factor II, sees heme at [facility]  Resume after 1 week of Heparin therapy completed Patient not taking: Reported on 09/19/2014 08/07/11   Arlan Organ, CNM   Procedures: Cesarean section delivery on 10/01/2014 with delivery of viable female newborn by Dr Juliene Pina   See operative report for further details APGAR (1 MIN): 9   APGAR (5 MINS): 9     Postoperative / postpartum course:  Uncomplicated with discharge on POD 2  Discharge Instructions:  Discharged Condition: stable  Activity: pelvic rest and postoperative restrictions x 2   Diet: Low carb - protein intake EVERY meal and bedtime  Medications:    Medication List    STOP taking these medications        aspirin 81 MG tablet     diphenhydrAMINE 25 MG tablet  Commonly known as:  BENADRYL     heparin 5000 UNIT/ML injection      TAKE these medications        acetaminophen 325 MG tablet  Commonly known as:  TYLENOL  Take 2 tablets (650 mg total) by mouth every 4 (four) hours as needed (for pain scale < 4).     ALIGN PO  Take 1 capsule by mouth daily.     docusate sodium 100 MG capsule  Commonly known as:  COLACE  Take 200 mg by mouth daily.     enoxaparin 40 MG/0.4ML injection  Commonly known as:  LOVENOX  Inject 0.4 mLs (40 mg total) into the skin daily. For protein S deficiency and rare prothrombin gene factor II, sees heme at [facility]  Resume after 1 week of Heparin therapy completed     loratadine 10 MG tablet  Commonly known as:  CLARITIN  Take 10 mg by mouth daily.     magnesium oxide 400 (241.3 MG) MG tablet  Commonly known as:  MAG-OX  Take 1 tablet (400 mg total) by mouth daily.     metFORMIN 500 MG tablet  Commonly known as:  GLUCOPHAGE  Take 1 tablet (500 mg total) by mouth 2 (two) times daily.  breakfast and  before bedtime     oxyCODONE 5 MG immediate release tablet  Commonly known as:  Oxy IR/ROXICODONE  Take 1 tablet (5 mg total) by mouth every 4 (four) hours as needed for moderate pain.     polyethylene glycol packet  Commonly known as:  MIRALAX / GLYCOLAX  Take 17 g by mouth daily as needed for mild constipation.     prenatal multivitamin Tabs tablet  Take 1 tablet by mouth daily.        Wound Care: keep clean and dry / remove honeycomb POD 5 Postpartum Instructions: [facility] discharge booklet - instructions  reviewed  Discharge to: Home  Follow up :   [facility] in 6 weeks for routine postpartum visit with Dr Juliene Pina                Signed: Marlinda Mike CNM, MSN, [facility] 10/03/2014, 9:09 AM

## 2014-10-03 NOTE — Progress Notes (Signed)
POSTOPERATIVE DAY # 2 S/P CS- repeat   S:         Reports feeling well - desires early DC             Tolerating po intake / no nausea / no vomiting / + flatus / no BM             Bleeding is light             Pain controlled with oxycondone and tylenol             Up ad lib / ambulatory/ voiding QS  Newborn breast feeding              Hx chronic constipation - fearful of impactation   O:  VS: BP 129/80 mmHg  Pulse 80  Temp(Src) 98 F (36.7 C) (Oral)  Resp 19  SpO2 99%  Breastfeeding? Unknown   LABS:               Recent Labs  10/01/14 1020 10/01/14 1230 10/02/14 0550  WBC 14.1*  --  11.7*  HGB 13.4 10.3* 9.4*  PLT 217  --  195               Bloodtype: --/--/O POS (06/15 1020)  Rubella: Immune, Immune (12/15 0000)                                         Physical Exam:             Alert and Oriented X3  Lungs: Clear and unlabored  Heart: regular rate and rhythm / no mumurs  Abdomen: soft, non-tender, non-distended, active BS, bruising at injection sites and top of vulva             Fundus: firm, non-tender, U-1             Dressing intact honeycomb              Incision:  approximated with suture / no erythema / no ecchymosis / no drainage  Perineum: intact  Lochia: light  Extremities: trace pedal edema, no calf pain or tenderness, negative Homans  A:        POD # 2 S/P CS            Protein S deficiency - lovenox restart today ( patient prefers HS dosing)            PCOS             Mild ABL anemia  P:        Routine postoperative care              DC home             Reviewed management for constipation prevention & boosting milk supply             Written instructions reviewed   Marlinda Mike CNM, MSN, [facility] 10/03/2014, 9:04 AM

## 2014-10-03 NOTE — Progress Notes (Signed)
Discharge teaching complete. Pt understood all instructions and did not have any questions. Pt ambulated out of the hospital and discharged home to family.  

## 2014-10-03 NOTE — Lactation Note (Signed)
This note was copied from the chart of Leah Sahasra Loeffler. Lactation Consultation Note Mom has PCOS and pump. Has good feedings. Started supplementing w/formula small amounts. Had 8 voids and 5 stools. I feel this is reason for 8 % weight loss. Will cont. To monitor. Patient Name: Leah Moreno [id number] Date: 10/03/2014     Maternal Data    Feeding Feeding Type: Bottle Fed - Formula Nipple Type: Slow - flow Length of feed: 9 min  LATCH Score/Interventions Latch: Repeated attempts needed to sustain latch, nipple held in mouth throughout feeding, stimulation needed to elicit sucking reflex.  Audible Swallowing: A few with stimulation Intervention(s): Hand expression;Skin to skin  Type of Nipple: Everted at rest and after stimulation  Comfort (Breast/Nipple): Soft / non-tender     Hold (Positioning): No assistance needed to correctly position infant at breast.  LATCH Score: 8  Lactation Tools Discussed/Used     Consult Status      Lavra Imler G 10/03/2014, 3:07 AM

## 2015-10-26 DIAGNOSIS — Z319 Encounter for procreative management, unspecified: Secondary | ICD-10-CM | POA: Diagnosis not present

## 2015-11-11 DIAGNOSIS — Z319 Encounter for procreative management, unspecified: Secondary | ICD-10-CM | POA: Diagnosis not present

## 2015-11-11 DIAGNOSIS — Z3141 Encounter for fertility testing: Secondary | ICD-10-CM | POA: Diagnosis not present

## 2015-11-12 DIAGNOSIS — N8500 Endometrial hyperplasia, unspecified: Secondary | ICD-10-CM | POA: Diagnosis not present

## 2016-01-04 DIAGNOSIS — Z3200 Encounter for pregnancy test, result unknown: Secondary | ICD-10-CM | POA: Diagnosis not present

## 2016-01-06 DIAGNOSIS — Z3201 Encounter for pregnancy test, result positive: Secondary | ICD-10-CM | POA: Diagnosis not present

## 2016-01-18 DIAGNOSIS — Z3200 Encounter for pregnancy test, result unknown: Secondary | ICD-10-CM | POA: Diagnosis not present

## 2016-02-01 DIAGNOSIS — O0901 Supervision of pregnancy with history of infertility, first trimester: Secondary | ICD-10-CM | POA: Diagnosis not present

## 2016-02-17 DIAGNOSIS — O021 Missed abortion: Secondary | ICD-10-CM | POA: Diagnosis not present

## 2016-02-17 DIAGNOSIS — Z3A08 8 weeks gestation of pregnancy: Secondary | ICD-10-CM | POA: Diagnosis not present

## 2016-02-17 DIAGNOSIS — Z23 Encounter for immunization: Secondary | ICD-10-CM | POA: Diagnosis not present

## 2016-02-17 DIAGNOSIS — Z113 Encounter for screening for infections with a predominantly sexual mode of transmission: Secondary | ICD-10-CM | POA: Diagnosis not present

## 2016-02-17 DIAGNOSIS — O09521 Supervision of elderly multigravida, first trimester: Secondary | ICD-10-CM | POA: Diagnosis not present

## 2016-02-19 DIAGNOSIS — O021 Missed abortion: Secondary | ICD-10-CM | POA: Diagnosis not present

## 2016-03-14 DIAGNOSIS — O032 Embolism following incomplete spontaneous abortion: Secondary | ICD-10-CM | POA: Diagnosis not present

## 2016-03-28 DIAGNOSIS — Z3141 Encounter for fertility testing: Secondary | ICD-10-CM | POA: Diagnosis not present

## 2016-03-28 DIAGNOSIS — Z319 Encounter for procreative management, unspecified: Secondary | ICD-10-CM | POA: Diagnosis not present

## 2016-08-01 ENCOUNTER — Ambulatory Visit (INDEPENDENT_AMBULATORY_CARE_PROVIDER_SITE_OTHER): Payer: BLUE CROSS/BLUE SHIELD | Admitting: Sports Medicine

## 2016-08-01 ENCOUNTER — Encounter: Payer: Self-pay | Admitting: Sports Medicine

## 2016-08-01 ENCOUNTER — Ambulatory Visit: Payer: Self-pay

## 2016-08-01 VITALS — BP 116/74 | HR 99 | Ht 63.0 in | Wt 191.2 lb

## 2016-08-01 DIAGNOSIS — M25531 Pain in right wrist: Secondary | ICD-10-CM | POA: Diagnosis not present

## 2016-08-01 DIAGNOSIS — S6981XA Other specified injuries of right wrist, hand and finger(s), initial encounter: Secondary | ICD-10-CM | POA: Diagnosis not present

## 2016-08-01 MED ORDER — DICLOFENAC SODIUM 2 % TD SOLN
1.0000 | Freq: Two times a day (BID) | TRANSDERMAL | 2 refills | Status: DC
Start: 2016-08-01 — End: 2017-06-20

## 2016-08-01 NOTE — Progress Notes (Signed)
OFFICE VISIT NOTE Leah Moreno. Leah Moreno Sports Medicine [facility] at [facility], [place] [phone]  Leah Moreno - 36 y.o. female MRN [id number]  Date of birth: 11/10/1980  Visit Date: 08/01/2016  PCP: No PCP Per Patient   Referred by: No ref. provider found  SUBJECTIVE:   Chief Complaint  Patient presents with  . pain in right wrist    Pt c/o pain in right wrist and hand x 1 mos. Leah Moreno does not recall any injury to the wrist/hand. Leah Moreno started using the elliptical 3 x a week for 30 mins and doesn't know if the pain could be related to that or other recently started activities. The pain is only when moving or gripping objects. Leah Moreno has been wearing a brace with some relief. Leah Moreno hasn't noticed any swelling. Pt denies radiation into the fingers or arm. Pain is mostly medial. Pain is mostly dull but there is a sharp pain with certain movement   HPI: As above. Additional pertinent information includes:  Symptoms as above.  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 Has 3 young children at home.  Does seem to be slightly worse in the morning after stiffness first thing in the morning after immobilization overnight but quickly improves.  Not taking any anti-inflammatories on a regular rash consistent basis although ibuprofen has been mildly helpful.   ROS: ROS  Otherwise per HPI.  HISTORY & PERTINENT PRIOR DATA:  No specialty comments available. Leah Moreno reports that Leah Moreno has never smoked. Leah Moreno has never used smokeless tobacco. No results for input(s): HGBA1C, LABURIC in the last 8760 hours. Medications & Allergies reviewed per EMR Patient Active Problem List   Diagnosis Date Noted  . 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 Cesarean delivery delivered 10/01/2014  . Postpartum care following cesarean delivery (6/15) 10/01/2014  . Protein S deficiency complicating pregnancy - delivered 08/05/2011  .  Borderline hypertension 08/05/2011   Past Medical History:  Diagnosis Date  . Blood dyscrasia    Protein S deficient, prothrombing gene factor 2  . Borderline hypertension 08/05/2011  . Heartburn in pregnancy   . Insulin resistance    takes Metformin- has PCOS  . PCOS (polycystic ovarian syndrome)   . Postpartum care following cesarean delivery (6/15) 10/01/2014  . PP care - s/p 1C/S 4/18 (twins) 08/05/2011  . Pregnancy induced hypertension 2013  . Protein S deficiency complicating pregnancy - delivered 08/05/2011  . Thrombocytopenia due to blood loss 08/05/2011  . Thrombocytopenia due to blood loss 08/05/2011   No family history on file. Past Surgical History:  Procedure Laterality Date  . CESAREAN SECTION  08/04/2011   Procedure: CESAREAN SECTION;  Surgeon: Robley Fries, MD;  Location: [facility];  Service: Gynecology;  Laterality: N/A;  Twins  EDD: 08/19/11  . CESAREAN SECTION N/A 10/01/2014   Procedure: Repeat CESAREAN SECTION;  Surgeon: Shea Evans, MD;  Location: [facility];  Service: Obstetrics;  Laterality: N/A;  EDD: 10/12/14   . IVF     egg retrieval   Social History   Occupational History  . Not on file.   Social History Main Topics  . Smoking status: Never Smoker  . Smokeless tobacco: Never Used  . Alcohol use No  . Drug use: No  . Sexual activity: Not on file    OBJECTIVE:  VS:  HT:5\' 3"  (160 cm)   WT:191 lb 3.2 oz (86.7 kg)  BMI:33.9  BP:116/74  HR:99bpm  TEMP: ( )  RESP:100 % Physical Exam  Constitutional: Leah Moreno appears well-developed and well-nourished. Leah Moreno is cooperative.  Non-toxic appearance.  HENT:  Head: Normocephalic and atraumatic.  Cardiovascular: Intact distal pulses.   Pulmonary/Chest: No accessory muscle usage. No respiratory distress.  Neurological: Leah Moreno is alert. Leah Moreno is not disoriented. Leah Moreno displays normal reflexes. No sensory deficit.  Skin: Skin is warm, dry and intact. Capillary refill takes less than 2 seconds. No abrasion and no rash noted.    Psychiatric: Leah Moreno has a normal mood and affect. Her speech is normal and behavior is normal. Thought content normal.    Right Wrist/Hand:  Overall wrist  is well aligned, no significant deformity or atrophy.    No significant effusion or swelling.    Grip strength intact.  Wrist extension strength is normal.  No significant TTP over the anatomic snuff box, distal radius & ulna, or proximal & distal carpal rows  Mild pain with ulnar deviation and supination/pronation.  No reproducible click.   IMAGING & PROCEDURES: No results found. No additional findings.   LIMITED MSK ULTRASOUND OF RIGHT WRIST Images were obtained and interpreted by myself, Gaspar Bidding, DO  Images have been saved and stored to PACS system. Images obtained on: GE S7 Ultrasound machine  FINDINGS:   Small amount of hypoechoic change within the TFCC with questionable interstitial tearing of the TFCC.  There is swelling with hypoechoic change around the sixth dorsal compartment consistent with mild tendinitis  IMPRESSION:  1. Mild extensor carpi ulnaris tendinitis with likely TFCC tear/degeneration   ASSESSMENT & PLAN:  Visit Diagnoses:  1. Right wrist pain   2. Injury of triangular fibrocartilage complex (TFCC) of right wrist, initial encounter    Meds:  Meds ordered this encounter  Medications  . Diclofenac Sodium (PENNSAID) 2 % SOLN    Sig: Place 1 application onto the skin 2 (two) times daily.    Dispense:  112 g    Refill:  2    Home Phone      [phone] Mobile          [phone]     Orders:  Orders Placed This Encounter  Procedures  . [place] LIMITED JOINT SPACE STRUCTURES UP RIGHT(NO LINKED CHARGES)    Follow-up: Return if symptoms worsen or fail to improve.   Otherwise please see problem oriented charting as below.

## 2016-08-01 NOTE — Assessment & Plan Note (Signed)
Mild swelling with questionable interstitial tear of the TFCC on the right.  Topical Pennsaid and overnight immobilization with wrist braces Ciardi has.  If any lack of improvement she will let me know outside of the office.  Consider injection if any lack of improvement

## 2016-08-03 ENCOUNTER — Telehealth: Payer: Self-pay | Admitting: Sports Medicine

## 2016-08-03 NOTE — Telephone Encounter (Signed)
Patient called complaining of nausea from medication prescribed. Since cma was unavailable, patient agreed to team health triage. Warm xfer

## 2016-08-03 NOTE — Telephone Encounter (Signed)
Patient Name: Leah Moreno  DOB: 06-15-1980    Initial Comment Caller states that she is having a reaction to a medication. She is having some nausea, she has severe nauseated, and stomach pain. She is putting the medication on her wrist and hands. She is taking Pennsaid.    Nurse Assessment  Nurse: Stefano Gaul, RN, Dwana Curd Date/Time (Eastern Time): 08/03/2016 12:08:07 PM  Confirm and document reason for call. If symptomatic, describe symptoms. ---Caller states she started on Pennsaid on Monday. Has been using on her wrists and hands BID. She is nauseated. Having slight abd pain.  Does the patient have any new or worsening symptoms? ---Yes  Will a triage be completed? ---Yes  Related visit to physician within the last 2 weeks? ---No  Does the PT have any chronic conditions? (i.e. diabetes, asthma, etc.) ---Yes  Is the patient pregnant or possibly pregnant? (Ask all females between the ages of 53-55) ---No  Is this a behavioral health or substance abuse call? ---No     Guidelines    Guideline Title Affirmed Question Affirmed Notes  Nausea Taking prescription medication that could cause nausea (e.g., narcotics/opiates, antibiotics, OCPs, many others)    Final Disposition User   Call PCP within 24 Hours Stringer, RN, Dwana Curd    Comments  pt does not want to make appt but wants to know if another medication can be called in. Please call pt back regarding medication   Disagree/Comply: Comply

## 2016-08-04 NOTE — Telephone Encounter (Signed)
Patient is calling because she is having allergic reaction possibly to Pennsaid.  Please give a call back to discuss.  Thank you,  -LL

## 2016-08-05 NOTE — Telephone Encounter (Signed)
I personally spoke with the patient.  The nausea has subsequently resolved but she has discontinued the Pennsaid.  She is going to hold off until her husband returns from out of town to resume the pen said and I recommend that she do this with famotidine on board.  She has developed an ulcer in the past while using naproxen but tolerates ibuprofen well.  Low likelihood that this is causing any type of significant GI issue but would recommend empiric prophylaxis either way.  She will call me if any other ongoing issues.

## 2016-08-05 NOTE — Telephone Encounter (Signed)
Forwarding to Dr. Rigby to advise.  

## 2017-02-09 DIAGNOSIS — Z131 Encounter for screening for diabetes mellitus: Secondary | ICD-10-CM | POA: Diagnosis not present

## 2017-02-09 DIAGNOSIS — Z130 Encounter for screening for diseases of the blood and blood-forming organs and certain disorders involving the immune mechanism: Secondary | ICD-10-CM | POA: Diagnosis not present

## 2017-02-09 DIAGNOSIS — Z1329 Encounter for screening for other suspected endocrine disorder: Secondary | ICD-10-CM | POA: Diagnosis not present

## 2017-02-09 DIAGNOSIS — Z0000 Encounter for general adult medical examination without abnormal findings: Secondary | ICD-10-CM | POA: Diagnosis not present

## 2017-02-09 DIAGNOSIS — Z6834 Body mass index (BMI) 34.0-34.9, adult: Secondary | ICD-10-CM | POA: Diagnosis not present

## 2017-02-09 DIAGNOSIS — Z1151 Encounter for screening for human papillomavirus (HPV): Secondary | ICD-10-CM | POA: Diagnosis not present

## 2017-02-09 DIAGNOSIS — Z13220 Encounter for screening for lipoid disorders: Secondary | ICD-10-CM | POA: Diagnosis not present

## 2017-02-09 DIAGNOSIS — Z01419 Encounter for gynecological examination (general) (routine) without abnormal findings: Secondary | ICD-10-CM | POA: Diagnosis not present

## 2017-02-09 LAB — HM PAP SMEAR: HM Pap smear: NEGATIVE

## 2017-02-21 DIAGNOSIS — Z23 Encounter for immunization: Secondary | ICD-10-CM | POA: Diagnosis not present

## 2017-06-06 ENCOUNTER — Ambulatory Visit: Payer: BLUE CROSS/BLUE SHIELD | Admitting: Family Medicine

## 2017-06-20 ENCOUNTER — Other Ambulatory Visit: Payer: Self-pay

## 2017-06-20 ENCOUNTER — Encounter: Payer: Self-pay | Admitting: Family Medicine

## 2017-06-20 ENCOUNTER — Ambulatory Visit (INDEPENDENT_AMBULATORY_CARE_PROVIDER_SITE_OTHER): Payer: BLUE CROSS/BLUE SHIELD | Admitting: Family Medicine

## 2017-06-20 VITALS — BP 130/82 | HR 95 | Temp 97.9°F | Resp 16 | Ht 63.0 in | Wt 189.4 lb

## 2017-06-20 DIAGNOSIS — R109 Unspecified abdominal pain: Secondary | ICD-10-CM

## 2017-06-20 DIAGNOSIS — R197 Diarrhea, unspecified: Secondary | ICD-10-CM | POA: Diagnosis not present

## 2017-06-20 DIAGNOSIS — D6852 Prothrombin gene mutation: Secondary | ICD-10-CM | POA: Insufficient documentation

## 2017-06-20 DIAGNOSIS — E282 Polycystic ovarian syndrome: Secondary | ICD-10-CM | POA: Diagnosis not present

## 2017-06-20 LAB — CBC WITH DIFFERENTIAL/PLATELET
Basophils Absolute: 0 10*3/uL (ref 0.0–0.1)
Basophils Relative: 0.4 % (ref 0.0–3.0)
EOS ABS: 0 10*3/uL (ref 0.0–0.7)
Eosinophils Relative: 0.7 % (ref 0.0–5.0)
HEMATOCRIT: 39.6 % (ref 36.0–46.0)
HEMOGLOBIN: 13.1 g/dL (ref 12.0–15.0)
LYMPHS PCT: 37 % (ref 12.0–46.0)
Lymphs Abs: 2.1 10*3/uL (ref 0.7–4.0)
MCHC: 33.1 g/dL (ref 30.0–36.0)
MCV: 81.9 fl (ref 78.0–100.0)
MONO ABS: 0.4 10*3/uL (ref 0.1–1.0)
Monocytes Relative: 6.8 % (ref 3.0–12.0)
NEUTROS ABS: 3.1 10*3/uL (ref 1.4–7.7)
Neutrophils Relative %: 55.1 % (ref 43.0–77.0)
PLATELETS: 304 10*3/uL (ref 150.0–400.0)
RBC: 4.84 Mil/uL (ref 3.87–5.11)
RDW: 14.1 % (ref 11.5–15.5)
WBC: 5.7 10*3/uL (ref 4.0–10.5)

## 2017-06-20 LAB — COMPREHENSIVE METABOLIC PANEL
ALT: 7 U/L (ref 0–35)
AST: 11 U/L (ref 0–37)
Albumin: 4.6 g/dL (ref 3.5–5.2)
Alkaline Phosphatase: 53 U/L (ref 39–117)
BUN: 9 mg/dL (ref 6–23)
CO2: 28 meq/L (ref 19–32)
CREATININE: 0.64 mg/dL (ref 0.40–1.20)
Calcium: 9.9 mg/dL (ref 8.4–10.5)
Chloride: 103 mEq/L (ref 96–112)
GFR: 110.14 mL/min (ref >60.00)
Glucose, Bld: 85 mg/dL (ref 70–99)
POTASSIUM: 4.5 meq/L (ref 3.5–5.1)
Sodium: 138 mEq/L (ref 135–145)
Total Bilirubin: 0.4 mg/dL (ref 0.2–1.2)
Total Protein: 7.9 g/dL (ref 6.0–8.3)

## 2017-06-20 LAB — TSH: TSH: 1.6 u[IU]/mL (ref 0.35–4.50)

## 2017-06-20 MED ORDER — HYOSCYAMINE SULFATE SL 0.125 MG SL SUBL: SUBLINGUAL_TABLET | SUBLINGUAL | 1 refills | Status: DC

## 2017-06-20 NOTE — Patient Instructions (Signed)
Please return in 4 weeks for recheck.  It was a pleasure meeting you today! Thank you for choosing [place] to meet your healthcare needs! I truly look forward to working with you. If you have any questions or concerns, please send me a message via Mychart or call the office at [phone].  Stop metformin  Irritable Bowel Syndrome, Adult Irritable bowel syndrome (IBS) is not one specific disease. It is a group of symptoms that affects the organs responsible for digestion (gastrointestinal or GI tract). To regulate how your GI tract works, your body sends signals back and forth between your intestines and your brain. If you have IBS, there may be a problem with these signals. As a result, your GI tract does not function normally. Your intestines may become more sensitive and overreact to certain things. This is especially true when you eat certain foods or when you are under stress. There are four types of IBS. These may be determined based on the consistency of your stool:  IBS with diarrhea.  IBS with constipation.  Mixed IBS.  Unsubtyped IBS.  It is important to know which type of IBS you have. Some treatments are more likely to be helpful for certain types of IBS. What are the causes? The exact cause of IBS is not known. What increases the risk? You may have a higher risk of IBS if:  You are a woman.  You are younger than 39 years old.  You have a family history of IBS.  You have mental health problems.  You have had bacterial infection of your GI tract.  What are the signs or symptoms? Symptoms of IBS vary from person to person. The main symptom is abdominal pain or discomfort. Additional symptoms usually include one or more of the following:  Diarrhea, constipation, or both.  Abdominal swelling or bloating.  Feeling full or sick after eating a small or regular-size meal.  Frequent gas.  Mucus in the stool.  A feeling of having more stool left after a bowel  movement.  Symptoms tend to come and go. They may be associated with stress, psychiatric conditions, or nothing at all. How is this diagnosed? There is no specific test to diagnose IBS. Your health care provider will make a diagnosis based on a physical exam, medical history, and your symptoms. You may have other tests to rule out other conditions that may be causing your symptoms. These may include:  Blood tests.  X-rays.  CT scan.  Endoscopy and colonoscopy. This is a test in which your GI tract is viewed with a long, thin, flexible tube.  How is this treated? There is no cure for IBS, but treatment can help relieve symptoms. IBS treatment often includes:  Changes to your diet, such as: ? Eating more fiber. ? Avoiding foods that cause symptoms. ? Drinking more water. ? Eating regular, medium-sized portioned meals.  Medicines. These may include: ? Fiber supplements if you have constipation. ? Medicine to control diarrhea (antidiarrheal medicines). ? Medicine to help control muscle spasms in your GI tract (antispasmodic medicines). ? Medicines to help with any mental health issues, such as antidepressants or tranquilizers.  Therapy. ? Talk therapy may help with anxiety, depression, or other mental health issues that can make IBS symptoms worse.  Stress reduction. ? Managing your stress can help keep symptoms under control.  Follow these instructions at home:  Take medicines only as directed by your health care provider.  Eat a healthy diet. ? Avoid foods and  drinks with added sugar. ? Include more whole grains, fruits, and vegetables gradually into your diet. This may be especially helpful if you have IBS with constipation. ? Avoid any foods and drinks that make your symptoms worse. These may include dairy products and caffeinated or carbonated drinks. ? Do not eat large meals. ? Drink enough fluid to keep your urine clear or pale yellow.  Exercise regularly. Ask your  health care provider for recommendations of good activities for you.  Keep all follow-up visits as directed by your health care provider. This is important. Contact a health care provider if:  You have constant pain.  You have trouble or pain with swallowing.  You have worsening diarrhea. Get help right away if:  You have severe and worsening abdominal pain.  You have diarrhea and: ? You have a rash, stiff neck, or severe headache. ? You are irritable, sleepy, or difficult to awaken. ? You are weak, dizzy, or extremely thirsty.  You have bright red blood in your stool or you have black tarry stools.  You have unusual abdominal swelling that is painful.  You vomit continuously.  You vomit blood (hematemesis).  You have both abdominal pain and a fever. This information is not intended to replace advice given to you by your health care provider. Make sure you discuss any questions you have with your health care provider. Document Released: 04/04/2005 Document Revised: 09/04/2015 Document Reviewed: 12/20/2013 Elsevier Interactive Patient Education  2018 [employer]

## 2017-06-20 NOTE — Progress Notes (Signed)
Subjective  CC:  Chief Complaint  Patient presents with  . Establish Care    Possible IBS symptoms, Needs a CPE    HPI: Leah Moreno is a 37 y.o. female who presents to [facility] today to establish care with me as a new patient.   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  She has a hectic lifestyle.  Her husband travels and is a partner at a law firm.  They went through IVF to have all of the children.  She was doing well until last summer  In August, her grandfather passed away.  She was able to help take care of him his last 2 weeks of life, however after his death she had an extreme emotional response.  She was sad and irritable, describes her symptoms almost as PTSD.  She worked through those.  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  She has a mild history of postpartum blues not treated.  She denies symptoms of depression at this time.  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  She had some cramping.  Never had fevers, chills, mucoid or bloody stools.  She changed her diet significantly: FODMAP diet with improvement of her symptoms, however she still does not feel right.  She is fatigued.  She feels overwhelmed.  She is very concerned about her abdominal symptoms.  She has no history of IBS-like symptoms however before.  Her sister does have Crohn's disease.  Last lab work done in the summer by her GYN was all normal.  Due to her restrictive diet, she has lost 10 pounds.  Past medical history is also significant for PCOS on metformin with most recent A1c of 5.2.  Obesity.  Protein S deficiency without history of clots.  We updated and reviewed the patient's past history in detail and it is  documented below.  Patient Active Problem List   Diagnosis Date Noted  . 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 PCOS (polycystic ovarian syndrome)   . 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 Protein S deficiency complicating pregnancy - delivered 08/05/2011  . Borderline hypertension 08/05/2011   Health Maintenance  Topic Date Due  . TETANUS/TDAP  01/19/1998  . PAP SMEAR  02/15/2020  . INFLUENZA VACCINE  Completed  . HIV Screening  Completed   There is no immunization history for the selected administration types on file for this patient. Current Meds  Medication Sig  . acetaminophen (TYLENOL) 325 MG tablet Take 2 tablets (650 mg total) by mouth every 4 (four) hours as needed (for pain scale < 4).  . aspirin EC 81 MG tablet Take 81 mg by mouth daily.  . diphenhydrAMINE (BENADRYL ALLERGY) 25 MG tablet Take 25 mg by mouth at bedtime.  Marland Kitchen. loratadine (CLARITIN) 10 MG tablet Take 10 mg by mouth daily.  . metFORMIN (GLUCOPHAGE) 1000 MG tablet Take 1,000 mg by mouth 2 (two) times daily with a meal.   . polyethylene glycol (MIRALAX / GLYCOLAX) packet Take 17 g by mouth daily as needed for mild constipation.    Allergies: Patient is allergic to naproxen. Past Medical History Patient  has a past medical history of Allergy, Blood dyscrasia,  Borderline hypertension (08/05/2011), Chicken pox, Clotting disorder (HCC), Insulin resistance, PCOS (polycystic ovarian syndrome), Pregnancy induced hypertension (2013), Protein S deficiency complicating pregnancy - delivered (08/05/2011), and Thrombocytopenia due to blood loss (08/05/2011). Past Surgical History Patient  has a past surgical history that includes IVF; Cesarean section (08/04/2011); and Cesarean section (N/A, 10/01/2014). Family History: Patient family history includes Cancer in her maternal grandfather; Crohn's disease in her sister; Drug abuse in her father; Early death in her father; Hearing loss in her  maternal grandfather; Irritable bowel syndrome in her mother; Mental illness in her maternal grandmother and paternal grandfather; Stroke in her brother. Social History:  Patient  reports that  has never smoked. she has never used smokeless tobacco. She reports that she does not drink alcohol or use drugs.  Review of Systems: Constitutional: negative for fever or malaise Ophthalmic: negative for photophobia, double vision or loss of vision Cardiovascular: negative for chest pain, dyspnea on exertion, or new LE swelling Respiratory: negative for SOB or persistent cough Gastrointestinal: negative for abdominal pain, positive for change in bowel habits, negative for melena Genitourinary: negative for dysuria or gross hematuria Musculoskeletal: negative for new gait disturbance or muscular weakness Integumentary: negative for new or persistent rashes Neurological: negative for TIA or stroke symptoms Psychiatric: negative for SI or delusions Allergic/Immunologic: negative for hives  Patient Care Team    Relationship Specialty Notifications Start End  Willow Ora, MD PCP - General Family Medicine  06/20/17   Shea Evans, MD Consulting Physician Obstetrics and Gynecology  06/20/17     Objective  Vitals: BP 130/82   Pulse 95   Temp 97.9 F (36.6 C) (Oral)   Resp 16   Ht 5\' 3"  (1.6 m)   Wt 189 lb 6.4 oz (85.9 kg)   LMP 06/13/2017   SpO2 97%   BMI 33.55 kg/m  General:  Well developed, well nourished, no acute distress  Psych:  Alert and oriented, anxious mood and affect HEENT:  Normocephalic, atraumatic, non-icteric sclera, PERRL, oropharynx is without mass or exudate, supple neck without adenopathy, mass or thyromegaly Cardiovascular:  RRR without gallop, rub or murmur, nondisplaced PMI Respiratory:  Good breath sounds bilaterally, CTAB with normal respiratory effort Gastrointestinal: Hyperactive bowel sounds, soft, non-tender, no noted masses. No HSM MSK: no deformities,  contusions. Joints are without erythema or swelling Skin:  Warm, no rashes or suspicious lesions noted Neurologic:    Mental status is normal. Gross motor and sensory exams are normal. Normal gait  Assessment  1. Diarrhea, unspecified type   2. Abdominal cramping   3. PCOS (polycystic ovarian syndrome)      Plan   Had long discussion regarding possible etiology and treatment/evaluation options for her concerns.  Patient thinks she has IBS but her history is not completely consistent with this as it just started out of the blue and was rather severe.  Offered GI consultation for colonoscopy to rule out a microscopic colitis or other etiology.  Patient resists this idea at this time.  Instead, she agrees for lab evaluation, stool evaluation, trial of Levsin, monitor symptoms, evaluate for anxiety, and stop metformin.  We will follow-up in 4 weeks  Consider anxiety disorder and/or stress reaction: Consider restarting SSRI.  Started this conversation today.  Patient is hesitant.  Reassured that stopping her Metformin short-term will not been helpful.  Follow up:  Return in about 4 weeks (around 07/18/2017) for recheck.  Commons side effects, risks, benefits, and alternatives for medications and treatment plan prescribed today  were discussed, and the patient expressed understanding of the given instructions. Patient is instructed to call or message via MyChart if he/she has any questions or concerns regarding our treatment plan. No barriers to understanding were identified. We discussed Red Flag symptoms and signs in detail. Patient expressed understanding regarding what to do in case of urgent or emergency type symptoms.   Medication list was reconciled, printed and provided to the patient in AVS. Patient instructions and summary information was reviewed with the patient as documented in the AVS. This note was prepared with assistance of Dragon voice recognition software. Occasional wrong-word or  sound-a-like substitutions may have occurred due to the inherent limitations of voice recognition software  Orders Placed This Encounter  Procedures  . Stool culture  . TSH  . Stool, WBC/Lactoferrin  . Clostridium difficile Toxin B, Qualitative, Real-Time PCR  . Comprehensive metabolic panel  . CBC with Differential/Platelet   Meds ordered this encounter  Medications  . Hyoscyamine Sulfate SL (LEVSIN/SL) 0.125 MG SUBL    Sig: Take before meals as needed    Dispense:  60 each    Refill:  1

## 2017-06-21 DIAGNOSIS — R197 Diarrhea, unspecified: Secondary | ICD-10-CM | POA: Diagnosis not present

## 2017-06-22 ENCOUNTER — Ambulatory Visit: Payer: Self-pay | Admitting: Family Medicine

## 2017-06-22 LAB — FECAL LACTOFERRIN, QUANT
Fecal Lactoferrin: NEGATIVE
MICRO NUMBER: 90289745
SPECIMEN QUALITY: ADEQUATE

## 2017-06-22 LAB — TIQ-NTM

## 2017-06-22 LAB — CLOSTRIDIUM DIFFICILE TOXIN B, QUALITATIVE, REAL-TIME PCR: Toxigenic C. Difficile by PCR: NOT DETECTED

## 2017-06-25 LAB — STOOL CULTURE
MICRO NUMBER: 90289758
MICRO NUMBER:: 90289757
MICRO NUMBER:: 90289759
SHIGA RESULT: NOT DETECTED
SPECIMEN QUALITY:: ADEQUATE
SPECIMEN QUALITY:: ADEQUATE
SPECIMEN QUALITY:: ADEQUATE

## 2017-06-26 ENCOUNTER — Encounter: Payer: Self-pay | Admitting: Emergency Medicine

## 2017-07-18 ENCOUNTER — Encounter: Payer: Self-pay | Admitting: Family Medicine

## 2017-07-18 ENCOUNTER — Other Ambulatory Visit: Payer: Self-pay

## 2017-07-18 ENCOUNTER — Ambulatory Visit (INDEPENDENT_AMBULATORY_CARE_PROVIDER_SITE_OTHER): Payer: BLUE CROSS/BLUE SHIELD | Admitting: Family Medicine

## 2017-07-18 VITALS — BP 110/84 | HR 90 | Temp 98.7°F | Ht 63.0 in | Wt 189.2 lb

## 2017-07-18 DIAGNOSIS — F430 Acute stress reaction: Secondary | ICD-10-CM

## 2017-07-18 DIAGNOSIS — E282 Polycystic ovarian syndrome: Secondary | ICD-10-CM

## 2017-07-18 DIAGNOSIS — R109 Unspecified abdominal pain: Secondary | ICD-10-CM

## 2017-07-18 DIAGNOSIS — R197 Diarrhea, unspecified: Secondary | ICD-10-CM

## 2017-07-18 MED ORDER — SERTRALINE HCL 25 MG PO TABS: ORAL_TABLET | ORAL | 1 refills | Status: DC

## 2017-07-18 NOTE — Progress Notes (Signed)
Subjective  CC:  Chief Complaint  Patient presents with  . Follow-up    Patient reports for followup from 06/20/17- Patient states Diarrhea is better but is still following a strict diet.     HPI: Leah Moreno is a 37 y.o. female who presents to the office today to address the problems listed above in the chief complaint.  She is doing better overall. No more abdominal cramping nor diarrhea, however maintaining FODMAP diet. Weight has stabilized but she attributes that to stopping metformin - she is concerned because she is no longer losing weight. Denies h/o DM but on met 1000bid due to pcos. Last a1c on meds was 5.7. Reviewed negative stool studies and nl lab results.   Continues to admit to anxiety sxs and high stress levels. Denies depressive sxs. Had been on zoloft, lexapro and wellbutrin in past. zoloft worked best. 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 Is scheduling acupuncture treatments.   NO new sxs. Feeling 50% improved. Working on home life balance with her husband who is supportive.    I reviewed the patients updated PMH, FH, and SocHx.    Patient Active Problem List   Diagnosis Date Noted  . Prothrombin gene mutation ([place]) 06/20/2017  . PCOS (polycystic ovarian syndrome)   . 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 Protein S deficiency complicating pregnancy - delivered 08/05/2011  . Borderline hypertension 08/05/2011   Current Meds  Medication Sig  . acetaminophen (TYLENOL) 325 MG tablet Take 2 tablets (650 mg total) by mouth every 4 (four) hours as needed (for pain scale < 4).  . aspirin EC 81 MG tablet Take 81 mg by mouth daily.  . diphenhydrAMINE (BENADRYL ALLERGY) 25 MG tablet Take 25 mg by mouth at bedtime.  Marland Kitchen Hyoscyamine Sulfate SL (LEVSIN/SL) 0.125 MG SUBL Take before meals as needed  . loratadine (CLARITIN) 10 MG tablet Take 10 mg by mouth daily.    Allergies: Patient is  allergic to naproxen. Family History: Patient family history includes Cancer in her maternal grandfather; Crohn's disease in her sister; Drug abuse in her father; Early death in her father; Hearing loss in her maternal grandfather; Irritable bowel syndrome in her mother; Mental illness in her maternal grandmother and paternal grandfather; Stroke in her brother. Social History:  Patient  reports that she has never smoked. She has never used smokeless tobacco. She reports that she does not drink alcohol or use drugs.  Review of Systems: Constitutional: Negative for fever malaise or anorexia Cardiovascular: negative for chest pain Respiratory: negative for SOB or persistent cough Gastrointestinal: negative for abdominal pain Wt Readings from Last 3 Encounters:  07/18/17 189 lb 3.2 oz (85.8 kg)  06/20/17 189 lb 6.4 oz (85.9 kg)  08/01/16 191 lb 3.2 oz (86.7 kg)    Objective  Vitals: BP 110/84   Pulse 90   Temp 98.7 F (37.1 C)   Ht _0  (1.6 m)   Wt 189 lb 3.2 oz (85.8 kg)   LMP 07/12/2017   BMI 33.52 kg/m  General: no acute distress , A&Ox3 Psych: appears anxious but nl affect and speech Assessment  1. Diarrhea, unspecified type   2. Abdominal cramping   3. PCOS (polycystic ovarian syndrome)   4. Stress reaction      Plan   diarrhea:  Resolving. ? IBS vs infectious colitis vs other. Continue FODMAP and slowly reintroduce foods. levsin is cramping returns. Stay off metformin for  now and recheck A1c at next visit off meds.   PCOS: reassured. Will follow weight. Can restart metformin if she is gaining.   Stress reaction: feel that this is contributing to her sxs. Counseling done. rec stress mgt, consideration of restarting zoloft. She will discuss with her husband and let me know if she starts meds.  Follow up: Return in about 8 weeks (around 09/12/2017) for recheck.    Commons side effects, risks, benefits, and alternatives for medications and treatment plan prescribed today  were discussed, and the patient expressed understanding of the given instructions. Patient is instructed to call or message via MyChart if he/she has any questions or concerns regarding our treatment plan. No barriers to understanding were identified. We discussed Red Flag symptoms and signs in detail. Patient expressed understanding regarding what to do in case of urgent or emergency type symptoms.   Medication list was reconciled, printed and provided to the patient in AVS. Patient instructions and summary information was reviewed with the patient as documented in the AVS. This note was prepared with assistance of Dragon voice recognition software. Occasional wrong-word or sound-a-like substitutions may have occurred due to the inherent limitations of voice recognition software  No orders of the defined types were placed in this encounter.  Meds ordered this encounter  Medications  . sertraline (ZOLOFT) 25 MG tablet    Sig: Take 1 tablet (25 mg total) by mouth daily for 14 days, THEN 2 tablets (50 mg total) daily for 14 days.    Dispense:  42 tablet    Refill:  1

## 2017-07-18 NOTE — Patient Instructions (Addendum)
Please return in 6-8 weeks after starting medication for recheck.  If you have any questions or concerns, please don't hesitate to send me a message via MyChart or call the office at [phone]. Thank you for visiting with [place] today! It's our pleasure caring for you.   Stress and Stress Management Stress is a normal reaction to life events. It is what you feel when life demands more than you are used to or more than you can handle. Some stress can be useful. For example, the stress reaction can help you catch the last bus of the day, study for a test, or meet a deadline at work. But stress that occurs too often or for too long can cause problems. It can affect your emotional health and interfere with relationships and normal daily activities. Too much stress can weaken your immune system and increase your risk for physical illness. If you already have a medical problem, stress can make it worse. What are the causes? All sorts of life events may cause stress. An event that causes stress for one person may not be stressful for another person. Major life events commonly cause stress. These may be positive or negative. Examples include losing your job, moving into a new home, getting married, having a baby, or losing a loved one. Less obvious life events may also cause stress, especially if they occur day after day or in combination. Examples include working long hours, driving in traffic, caring for children, being in debt, or being in a difficult relationship. What are the signs or symptoms? Stress may cause emotional symptoms including, the following:  Anxiety. This is feeling worried, afraid, on edge, overwhelmed, or out of control.  Anger. This is feeling irritated or impatient.  Depression. This is feeling sad, down, helpless, or guilty.  Difficulty focusing, remembering, or making decisions.  Stress may cause physical symptoms, including the following:  Aches and pains. These may affect  your head, neck, back, stomach, or other areas of your body.  Tight muscles or clenched jaw.  Low energy or trouble sleeping.  Stress may cause unhealthy behaviors, including the following:  Eating to feel better (overeating) or skipping meals.  Sleeping too little, too much, or both.  Working too much or putting off tasks (procrastination).  Smoking, drinking alcohol, or using drugs to feel better.  How is this diagnosed? Stress is diagnosed through an assessment by your health care provider. Your health care provider will ask questions about your symptoms and any stressful life events.Your health care provider will also ask about your medical history and may order blood tests or other tests. Certain medical conditions and medicine can cause physical symptoms similar to stress. Mental illness can cause emotional symptoms and unhealthy behaviors similar to stress. Your health care provider may refer you to a mental health professional for further evaluation. How is this treated? Stress management is the recommended treatment for stress.The goals of stress management are reducing stressful life events and coping with stress in healthy ways. Techniques for reducing stressful life events include the following:  Stress identification. Self-monitor for stress and identify what causes stress for you. These skills may help you to avoid some stressful events.  Time management. Set your priorities, keep a calendar of events, and learn to say "no." These tools can help you avoid making too many commitments.  Techniques for coping with stress include the following:  Rethinking the problem. Try to think realistically about stressful events rather than ignoring them or overreacting.  Try to find the positives in a stressful situation rather than focusing on the negatives.  Exercise. Physical exercise can release both physical and emotional tension. The key is to find a form of exercise you enjoy  and do it regularly.  Relaxation techniques. These relax the body and mind. Examples include yoga, meditation, tai chi, biofeedback, deep breathing, progressive muscle relaxation, listening to music, being out in nature, journaling, and other hobbies. Again, the key is to find one or more that you enjoy and can do regularly.  Healthy lifestyle. Eat a balanced diet, get plenty of sleep, and do not smoke. Avoid using alcohol or drugs to relax.  Strong support network. Spend time with family, friends, or other people you enjoy being around.Express your feelings and talk things over with someone you trust.  Counseling or talktherapy with a mental health professional may be helpful if you are having difficulty managing stress on your own. Medicine is typically not recommended for the treatment of stress.Talk to your health care provider if you think you need medicine for symptoms of stress. Follow these instructions at home:  Keep all follow-up visits as directed by your health care provider.  Take all medicines as directed by your health care provider. Contact a health care provider if:  Your symptoms get worse or you start having new symptoms.  You feel overwhelmed by your problems and can no longer manage them on your own. Get help right away if:  You feel like hurting yourself or someone else. This information is not intended to replace advice given to you by your health care provider. Make sure you discuss any questions you have with your health care provider. Document Released: 09/28/2000 Document Revised: 09/10/2015 Document Reviewed: 11/27/2012 Elsevier Interactive Patient Education  2017 [employer].

## 2017-07-21 ENCOUNTER — Encounter: Payer: Self-pay | Admitting: Family Medicine

## 2017-07-21 NOTE — Telephone Encounter (Signed)
See Phone Note.   Kathi SimpersAmy Peterman,  LPN

## 2017-07-27 ENCOUNTER — Encounter: Payer: Self-pay | Admitting: Family Medicine

## 2017-08-29 ENCOUNTER — Encounter: Payer: Self-pay | Admitting: Family Medicine

## 2017-08-29 ENCOUNTER — Ambulatory Visit (INDEPENDENT_AMBULATORY_CARE_PROVIDER_SITE_OTHER): Payer: BLUE CROSS/BLUE SHIELD | Admitting: Family Medicine

## 2017-08-29 ENCOUNTER — Other Ambulatory Visit: Payer: Self-pay

## 2017-08-29 VITALS — BP 120/86 | HR 80 | Temp 99.2°F | Ht 63.0 in | Wt 186.6 lb

## 2017-08-29 DIAGNOSIS — R197 Diarrhea, unspecified: Secondary | ICD-10-CM

## 2017-08-29 DIAGNOSIS — E282 Polycystic ovarian syndrome: Secondary | ICD-10-CM | POA: Diagnosis not present

## 2017-08-29 DIAGNOSIS — K580 Irritable bowel syndrome with diarrhea: Secondary | ICD-10-CM | POA: Insufficient documentation

## 2017-08-29 DIAGNOSIS — F430 Acute stress reaction: Secondary | ICD-10-CM

## 2017-08-29 LAB — POCT GLYCOSYLATED HEMOGLOBIN (HGB A1C): Hemoglobin A1C: 5.5

## 2017-08-29 NOTE — Progress Notes (Signed)
Subjective  CC:  Chief Complaint  Patient presents with  . Anxiety    Patient unable to take zoloft, she states it increased anxiety and was unable to sleep. Patient states she is making lifestyle changes and  doing better.   . Diarrhea    HPI: Leah Moreno is a 37 y.o. female who presents to the office today to address the problems listed above in the chief complaint. Here for f/u diarrhea and stress/mood. Last time started zoloft but couldn't tolerate; stopped after a week.  Doing better as stated above.  Realizes that her sxs are all correlated with stressors. Managing better. Trying to balance her work and home life. Asking for more help. No longer with diarrhea.   PCOS - has heavy menses and pms off metformin; restarted.   Wt Readings from Last 3 Encounters:  08/29/17 186 lb 9.6 oz (84.6 kg)  07/18/17 189 lb 3.2 oz (85.8 kg)  06/20/17 189 lb 6.4 oz (85.9 kg)    I reviewed the patients updated PMH, FH, and SocHx.    Patient Active Problem List   Diagnosis Date Noted  . 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 PCOS (polycystic ovarian syndrome)   . 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 Protein S deficiency complicating pregnancy - delivered 08/05/2011  . Borderline hypertension 08/05/2011   Current Meds  Medication Sig  . acetaminophen (TYLENOL) 325 MG tablet Take 2 tablets (650 mg total) by mouth every 4 (four) hours as needed (for pain scale < 4).  . aspirin EC 81 MG tablet Take 81 mg by mouth daily.  Marland Kitchen loratadine (CLARITIN) 10 MG tablet Take 10 mg by mouth daily.  . metFORMIN (GLUCOPHAGE) 500 MG tablet Take 500 mg by mouth 2 (two) times daily.    Allergies: Patient is allergic to naproxen and zoloft [sertraline hcl]. Family History: Patient family history includes Cancer in her maternal grandfather; Crohn's disease in her sister; Drug abuse in her father; Early death in  her father; Hearing loss in her maternal grandfather; Irritable bowel syndrome in her mother; Mental illness in her maternal grandmother and paternal grandfather; Stroke in her brother. Social History:  Patient  reports that she has never smoked. She has never used smokeless tobacco. She reports that she does not drink alcohol or use drugs.  Review of Systems: Constitutional: Negative for fever malaise or anorexia Cardiovascular: negative for chest pain Respiratory: negative for SOB or persistent cough Gastrointestinal: negative for abdominal pain  Objective  Vitals: BP 120/86   Pulse 80   Temp 99.2 F (37.3 C)   Ht  (1.6 m)   Wt 186 lb 9.6 oz (84.6 kg)   LMP 08/13/2017 (Exact Date)   BMI 33.05 kg/m  General: no acute distress , A&Ox3  Assessment  1. PCOS (polycystic ovarian syndrome)   2. Diarrhea, unspecified type   3. Stress reaction   4. Irritable bowel syndrome with diarrhea      Plan   PCOS:  With pms: restart metformin 500 bid. Nl a1c today  Diarrhea: most c/w IBS-D related to stress. Pt agrees. Now improved.   Sress: to manage behaviorally; consider counseling if needed. No SSRI for now  Follow up: Return in about 6 months (around 03/01/2018) for complete physical.  She will stagger with GYN cpe   Commons side effects, risks, benefits, and alternatives for medications and treatment plan prescribed today were discussed, and  the patient expressed understanding of the given instructions. Patient is instructed to call or message via MyChart if he/she has any questions or concerns regarding our treatment plan. No barriers to understanding were identified. We discussed Red Flag symptoms and signs in detail. Patient expressed understanding regarding what to do in case of urgent or emergency type symptoms.   Medication list was reconciled, printed and provided to the patient in AVS. Patient instructions and summary information was reviewed with the patient as documented  in the AVS. This note was prepared with assistance of Dragon voice recognition software. Occasional wrong-word or sound-a-like substitutions may have occurred due to the inherent limitations of voice recognition software  Orders Placed This Encounter  Procedures  . POCT glycosylated hemoglobin (Hb A1C)   No orders of the defined types were placed in this encounter.

## 2017-08-29 NOTE — Patient Instructions (Addendum)
Please return in 6-12 months for your annual complete physical; please come fasting. Please ask Dr. Juliene Pina to send me her report when you see her. Thanks!   If you have any questions or concerns, please don't hesitate to send me a message via MyChart or call the office at [phone]. Thank you for visiting with [place] today! It's our pleasure caring for you.  Consider a meditation app like calm or aura.

## 2017-08-31 ENCOUNTER — Encounter: Payer: Self-pay | Admitting: Family Medicine

## 2017-08-31 MED ORDER — METFORMIN HCL ER 500 MG PO TB24: 500.0000 mg | ORAL_TABLET | Freq: Every day | ORAL | 5 refills | Status: DC

## 2017-09-15 ENCOUNTER — Encounter: Payer: Self-pay | Admitting: Family Medicine

## 2017-10-22 ENCOUNTER — Encounter: Payer: Self-pay | Admitting: Family Medicine

## 2017-10-22 DIAGNOSIS — K580 Irritable bowel syndrome with diarrhea: Secondary | ICD-10-CM

## 2017-10-23 ENCOUNTER — Encounter: Payer: Self-pay | Admitting: Internal Medicine

## 2017-11-03 ENCOUNTER — Encounter: Payer: Self-pay | Admitting: Family Medicine

## 2017-11-06 MED ORDER — METFORMIN HCL ER (MOD) 1000 MG PO TB24: 1000.0000 mg | ORAL_TABLET | Freq: Every day | ORAL | 3 refills | Status: DC

## 2017-11-06 NOTE — Addendum Note (Signed)
Addended by: Asencion PartridgeANDY, CAMILLE on: 11/06/2017 08:25 AM   Modules accepted: Orders

## 2017-11-07 ENCOUNTER — Telehealth: Payer: Self-pay | Admitting: Emergency Medicine

## 2017-11-07 NOTE — Telephone Encounter (Signed)
  Trinna PostStephanie BCBS [phone]  Judeth CornfieldStephanie needs some clinical info to complete a prior auth for this pt.  She will fax over the list of what she needs.  Please call with any questions.   [facility]   Clincical Information filled out and faxed back,  Gustavia Audie PintoPeterman,  LPN

## 2017-11-13 ENCOUNTER — Telehealth: Payer: Self-pay | Admitting: Emergency Medicine

## 2017-11-13 ENCOUNTER — Other Ambulatory Visit: Payer: Self-pay | Admitting: Family Medicine

## 2017-11-13 MED ORDER — METFORMIN HCL 500 MG PO TABS: 500.0000 mg | ORAL_TABLET | Freq: Two times a day (BID) | ORAL | 3 refills | Status: DC

## 2017-11-13 NOTE — Telephone Encounter (Signed)
Please let pt know that the extended release formulation is not covered for tx of PCOS>  Thus I recommend taking the generic metformin 500mg  twice a day and have ordered that.   Thanks.

## 2017-11-13 NOTE — Telephone Encounter (Signed)
[facility], Metformin ER 1000mg  is not covered because it is not used for the treatment of Type 2 diabetes.   Please advise.   Kathi SimpersAmy Peterman,  LPN

## 2017-11-13 NOTE — Telephone Encounter (Signed)
LM to RC  

## 2017-11-14 ENCOUNTER — Encounter: Payer: Self-pay | Admitting: Family Medicine

## 2017-11-14 NOTE — Telephone Encounter (Signed)
Spoke with Patient and advised patient about medication denial. Patient states that she is going to check with the pharmacy because they offered her a coupon for Free Metformin. Patient stated she would call back.  Kathi SimpersAmy Peterman,  LPN

## 2017-12-19 ENCOUNTER — Ambulatory Visit: Payer: BLUE CROSS/BLUE SHIELD | Admitting: Internal Medicine

## 2018-02-13 ENCOUNTER — Ambulatory Visit: Payer: BLUE CROSS/BLUE SHIELD | Admitting: Internal Medicine

## 2018-04-23 ENCOUNTER — Ambulatory Visit: Payer: Self-pay

## 2018-04-23 ENCOUNTER — Ambulatory Visit (INDEPENDENT_AMBULATORY_CARE_PROVIDER_SITE_OTHER): Payer: BLUE CROSS/BLUE SHIELD | Admitting: Family Medicine

## 2018-04-23 ENCOUNTER — Other Ambulatory Visit: Payer: Self-pay

## 2018-04-23 ENCOUNTER — Encounter: Payer: Self-pay | Admitting: Family Medicine

## 2018-04-23 VITALS — BP 128/76 | HR 75 | Temp 98.3°F | Resp 16 | Ht 63.0 in | Wt 195.4 lb

## 2018-04-23 DIAGNOSIS — L255 Unspecified contact dermatitis due to plants, except food: Secondary | ICD-10-CM

## 2018-04-23 MED ORDER — PREDNISONE 10 MG PO TABS: ORAL_TABLET | ORAL | 0 refills | Status: DC

## 2018-04-23 MED ORDER — CEPHALEXIN 500 MG PO CAPS: 500.0000 mg | ORAL_CAPSULE | Freq: Two times a day (BID) | ORAL | 0 refills | Status: DC

## 2018-04-23 NOTE — Telephone Encounter (Signed)
FYI, pt coming in today 

## 2018-04-23 NOTE — Patient Instructions (Signed)
Please return in 1-3 months for your annual complete physical; please come fasting.   If you have any questions or concerns, please don't hesitate to send me a message via MyChart or call the office at [phone]. Thank you for visiting with [place] today! It's our pleasure caring for you.   Poison Ivy Dermatitis  Poison ivy dermatitis is inflammation of the skin that is caused by the allergens on the leaves of the poison ivy plant. The skin reaction often involves redness, swelling, blisters, and extreme itching. What are the causes? This condition is caused by a specific chemical (urushiol) found in the sap of the poison ivy plant. This chemical is sticky and can be easily spread to people, animals, and objects. You can get poison ivy dermatitis by:  Having direct contact with a poison ivy plant.  Touching animals, other people, or objects that have come in contact with poison ivy and have the chemical on them. What increases the risk? This condition is more likely to develop in:  People who are outdoors often.  People who go outdoors without wearing protective clothing, such as closed shoes, long pants, and a long-sleeved shirt. What are the signs or symptoms? Symptoms of this condition include:  Redness and itching.  A rash that often includes bumps and blisters. The rash usually appears 48 hours after exposure.  Swelling. This may occur if the reaction is more severe. Symptoms usually last for 1-2 weeks. However, the first time you develop this condition, symptoms may last 3-4 weeks. How is this diagnosed? This condition may be diagnosed based on your symptoms and a physical exam. Your health care provider may also ask you about any recent outdoor activity. How is this treated? Treatment for this condition will vary depending on how severe it is. Treatment may include:  Hydrocortisone creams or calamine lotions to relieve itching.  Oatmeal baths to soothe the  skin.  Over-the-counter antihistamine tablets.  Oral steroid medicine for more severe outbreaks. Follow these instructions at home:  Take or apply over-the-counter and prescription medicines only as told by your health care provider.  Wash exposed skin as soon as possible with soap and cold water.  Use hydrocortisone creams or calamine lotion as needed to soothe the skin and relieve itching.  Take oatmeal baths as needed. Use colloidal oatmeal. You can get this at your local pharmacy or grocery store. Follow the instructions on the packaging.  Do not scratch or rub your skin.  While you have the rash, wash clothes right after you wear them. How is this prevented?   Learn to identify the poison ivy plant and avoid contact with the plant. This plant can be recognized by the number of leaves. Generally, poison ivy has three leaves with flowering branches on a single stem. The leaves are typically glossy, and they have jagged edges that come to a point at the front.  If you have been exposed to poison ivy, thoroughly wash with soap and water right away. You have about 30 minutes to remove the plant resin before it will cause the rash. Be sure to wash under your fingernails because any plant resin there will continue to spread the rash.  When hiking or camping, wear clothes that will help you to avoid exposure on the skin. This includes long pants, a long-sleeved shirt, tall socks, and hiking boots. You can also apply preventive lotion to your skin to help limit exposure.  If you suspect that your clothes or outdoor gear  came in contact with poison ivy, rinse them off outside with a garden hose before you bring them inside your house. Contact a health care provider if:  You have open sores in the rash area.  You have more redness, swelling, or pain in the affected area.  You have redness that spreads beyond the rash area.  You have fluid, blood, or pus coming from the affected  area.  You have a fever.  You have a rash over a large area of your body.  You have a rash on your eyes, mouth, or genitals.  Your rash does not improve after a few days. Get help right away if:  Your face swells or your eyes swell shut.  You have trouble breathing.  You have trouble swallowing. This information is not intended to replace advice given to you by your health care provider. Make sure you discuss any questions you have with your health care provider. Document Released: 04/01/2000 Document Revised: 09/15/2016 Document Reviewed: 09/10/2014 Elsevier Interactive Patient Education  2019 [employer].

## 2018-04-23 NOTE — Telephone Encounter (Signed)
Patient called in with c/o "poison ivy exposure." She says "I was exposed 9 days ago and 2 days after, I got the rash to my right arm. It's spread now to my face and my face and right arm are swollen, arm more noticeable than my face. I have red areas that have blisters, some are dried and some are still oozing. I'm concerned because of the swelling. I have tried OTC wash for poison ivy that helped with the itching and I was able to sleep better over the weekend." I asked about the severity, she says "it's was moderate-sever this past weekend." According to protocol, see PCP within 24 hours, appointment scheduled for today at 1445 with Dr. Mardelle Matte, care advice given, patient verbalized understanding.  Reason for Disposition . [1] Face, eyes, lips or genitals are involved AND [2] more than a small rash  Answer Assessment - Initial Assessment Questions 1. APPEARANCE of RASH: "Describe the rash."      Red, blisters 2. LOCATION: "Where is the rash located?"      Right arm is swollen with rash, Face is swollen and has rash 3. SIZE: "How large is the rash?"      Right arm big area with blisters that are drying up 4. ONSET: "When did the rash begin?"     9 days ago exposed, rash came up 2 days later 5. ITCHING: "Does the rash itch?" If so, ask: "How bad is it?"   - MILD - doesn't interfere with normal activities   - MODERATE - SEVERE: interferes with work, school, sleep, or other activities      Moderate-Severe over the weekend; controllable with OTC poison ivy wash 6. PREGNANCY: "Is there any chance you are pregnant?" "When was your last menstrual period?"    No  Protocols used: POISON IVY - OAK - SUMAC-A-AH

## 2018-04-23 NOTE — Progress Notes (Signed)
Subjective  CC:  Chief Complaint  Patient presents with  . Rash    Exposed to posion ivy last Saturday.. She has used Hydrocortisone, tee tree oil, and an OTC poison ivy wash    HPI: Leah Moreno is a 38 y.o. female who presents to the office today to address the problems listed above in the chief complaint.  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 Now worsening x > 1 week on right forearm- red and weeping clear drainage, and face and torso.   No systemic sxs. Tolerated prednisone.    Assessment  1. Rhus dermatitis      Plan   PI:  Education. 2 week pred taper. Supportive care. Keflex IF becomes infected.   Follow up: Return in about 4 weeks (around 05/21/2018) for complete physical.  Visit date not found  No orders of the defined types were placed in this encounter.  Meds ordered this encounter  Medications  . predniSONE (DELTASONE) 10 MG tablet    Sig: Take 6 tabs qd x 3d, 4 tabs qd x 3d, 3 tabs qd x 3d, 2 tabs qd x 3d, 1 tab qd x 3d    Dispense:  48 tablet    Refill:  0  . cephALEXin (KEFLEX) 500 MG capsule    Sig: Take 1 capsule (500 mg total) by mouth 2 (two) times daily.    Dispense:  14 capsule    Refill:  0      I reviewed the patients updated PMH, FH, and SocHx.    Patient Active Problem List   Diagnosis Date Noted  . 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 PCOS (polycystic ovarian syndrome)   . 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 Protein S deficiency complicating pregnancy - delivered 08/05/2011  . Borderline hypertension 08/05/2011   Current Meds  Medication Sig  . acetaminophen (TYLENOL) 325 MG tablet Take 2 tablets (650 mg total) by mouth every 4 (four) hours as needed (for pain scale < 4).  . aspirin EC 81 MG tablet Take 81 mg by mouth daily.  . diphenhydrAMINE (BENADRYL ALLERGY) 25 MG tablet Take 25 mg by mouth at bedtime.  Marland Kitchen loratadine (CLARITIN) 10 MG  tablet Take 10 mg by mouth daily.  . metformin (FORTAMET) 500 MG (OSM) 24 hr tablet Take 1,000 mg by mouth daily with breakfast.  . polyethylene glycol (MIRALAX / GLYCOLAX) packet Take 17 g by mouth daily as needed for mild constipation.  . [DISCONTINUED] metFORMIN (GLUCOPHAGE) 500 MG tablet Take 1 tablet (500 mg total) by mouth 2 (two) times daily with a meal. (Patient taking differently: Take 1,000 mg by mouth daily with breakfast. )    Allergies: Patient is allergic to naproxen and zoloft [sertraline hcl]. Family History: Patient family history includes Cancer in her maternal grandfather; Crohn's disease in her sister; Drug abuse in her father; Early death in her father; Hearing loss in her maternal grandfather; Irritable bowel syndrome in her mother; Mental illness in her maternal grandmother and paternal grandfather; Stroke in her brother. Social History:  Patient  reports that she has never smoked. She has never used smokeless tobacco. She reports that she does not drink alcohol or use drugs.  Review of Systems: Constitutional: Negative for fever malaise or anorexia Cardiovascular: negative for chest pain Respiratory: negative for SOB or persistent cough Gastrointestinal: negative for abdominal pain  Objective  Vitals: BP 128/76   Pulse  75   Temp 98.3 F (36.8 C) (Oral)   Resp 16   Ht 5\' 3"  (1.6 m)   Wt 195 lb 6.4 oz (88.6 kg)   SpO2 98%   BMI 34.61 kg/m  General: no acute distress , A&Ox3 Skin:  Warm, right forearm with large erythematous area with crusting and vesicles and soft tissue swelling; erythematous raised patches on face     Commons side effects, risks, benefits, and alternatives for medications and treatment plan prescribed today were discussed, and the patient expressed understanding of the given instructions. Patient is instructed to call or message via MyChart if he/she has any questions or concerns regarding our treatment plan. No barriers to understanding  were identified. We discussed Red Flag symptoms and signs in detail. Patient expressed understanding regarding what to do in case of urgent or emergency type symptoms.   Medication list was reconciled, printed and provided to the patient in AVS. Patient instructions and summary information was reviewed with the patient as documented in the AVS. This note was prepared with assistance of Dragon voice recognition software. Occasional wrong-word or sound-a-like substitutions may have occurred due to the inherent limitations of voice recognition software

## 2018-05-05 ENCOUNTER — Encounter: Payer: Self-pay | Admitting: Family Medicine

## 2018-05-07 MED ORDER — PREDNISONE 10 MG PO TABS: ORAL_TABLET | ORAL | 0 refills | Status: DC

## 2018-05-18 ENCOUNTER — Encounter: Payer: Self-pay | Admitting: Family Medicine

## 2018-05-18 ENCOUNTER — Ambulatory Visit (INDEPENDENT_AMBULATORY_CARE_PROVIDER_SITE_OTHER): Payer: BLUE CROSS/BLUE SHIELD | Admitting: Family Medicine

## 2018-05-18 ENCOUNTER — Other Ambulatory Visit: Payer: Self-pay

## 2018-05-18 VITALS — BP 132/84 | HR 104 | Temp 98.5°F | Resp 16 | Ht 63.0 in | Wt 197.6 lb

## 2018-05-18 DIAGNOSIS — L255 Unspecified contact dermatitis due to plants, except food: Secondary | ICD-10-CM

## 2018-05-18 MED ORDER — MOMETASONE FUROATE 0.1 % EX CREA: 1.0000 "application " | TOPICAL_CREAM | Freq: Two times a day (BID) | CUTANEOUS | 0 refills | Status: DC

## 2018-05-18 NOTE — Patient Instructions (Signed)
Please use the steroid cream twice a day for the next 3 days.  IF things are improving, then may decrease to once daily for a week.   IF they are not, mychart message me and I will help you decide if we need to use oral or injectable prednisone again.

## 2018-05-18 NOTE — Progress Notes (Signed)
Subjective  CC:  Chief Complaint  Patient presents with  . Poison Ivy    Reports that recent flare up started a few days ago on the middle right arm    HPI: Leah Moreno is a 38 y.o. female who presents to the office today to address the problems listed above in the chief complaint.  See last notes; treated for 3 weeks with pred taper x 2 due to severe poison ivy outbreak. Did well, but last pred 2 days ago and new small patch appearing on right forearm again. Mild itching. No weeping. Hasn't had known new exposure.    Assessment  1. Rhus dermatitis      Plan   PI:  Had discussion of mgt options; would like to avoid further systemic steroids if possible. Elocon bid x 3 days, then daily for a week. IF worsening will retreat with low dose small taper vs kenalog injection depending upon severity of outbreak. Patient understands and agrees with care plan.    Follow up: prn  Visit date not found  No orders of the defined types were placed in this encounter.  Meds ordered this encounter  Medications  . mometasone (ELOCON) 0.1 % cream    Sig: Apply 1 application topically 2 (two) times daily.    Dispense:  45 g    Refill:  0      I reviewed the patients updated PMH, FH, and SocHx.    Patient Active Problem List   Diagnosis Date Noted  . 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 PCOS (polycystic ovarian syndrome)   . 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 Protein S deficiency complicating pregnancy - delivered 08/05/2011  . Borderline hypertension 08/05/2011   Current Meds  Medication Sig  . acetaminophen (TYLENOL) 325 MG tablet Take 2 tablets (650 mg total) by mouth every 4 (four) hours as needed (for pain scale < 4).  . aspirin EC 81 MG tablet Take 81 mg by mouth daily.  . diphenhydrAMINE (BENADRYL ALLERGY) 25 MG tablet Take 25 mg by mouth at bedtime.  Marland Kitchen loratadine (CLARITIN)  10 MG tablet Take 10 mg by mouth daily.  . metformin (FORTAMET) 500 MG (OSM) 24 hr tablet Take 1,000 mg by mouth daily with breakfast.  . polyethylene glycol (MIRALAX / GLYCOLAX) packet Take 17 g by mouth daily as needed for mild constipation.  . [DISCONTINUED] cephALEXin (KEFLEX) 500 MG capsule Take 1 capsule (500 mg total) by mouth 2 (two) times daily.  . [DISCONTINUED] predniSONE (DELTASONE) 10 MG tablet Take 4 tabs qd x 2 days, 3 qd x 2 days, 2 qd x 2d, 1qd x 3 days    Allergies: Patient is allergic to naproxen and zoloft [sertraline hcl]. Family History: Patient family history includes Cancer in her maternal grandfather; Crohn's disease in her sister; Drug abuse in her father; Early death in her father; Hearing loss in her maternal grandfather; Irritable bowel syndrome in her mother; Mental illness in her maternal grandmother and paternal grandfather; Stroke in her brother. Social History:  Patient  reports that she has never smoked. She has never used smokeless tobacco. She reports that she does not drink alcohol or use drugs.  Review of Systems: Constitutional: Negative for fever malaise or anorexia Cardiovascular: negative for chest pain Respiratory: negative for SOB or persistent cough Gastrointestinal: negative for abdominal pain  Objective  Vitals: BP 132/84   Pulse (!) 104  Temp 98.5 F (36.9 C) (Oral)   Resp 16   Ht 5\' 3"  (1.6 m)   Wt 197 lb 9.6 oz (89.6 kg)   SpO2 99%   BMI 35.00 kg/m  General: no acute distress , A&Ox3 Skin:  Warm, right forearm with annular erythematous rash with papules. Nonlinear. No vesicles.      Commons side effects, risks, benefits, and alternatives for medications and treatment plan prescribed today were discussed, and the patient expressed understanding of the given instructions. Patient is instructed to call or message via MyChart if he/she has any questions or concerns regarding our treatment plan. No barriers to understanding were  identified. We discussed Red Flag symptoms and signs in detail. Patient expressed understanding regarding what to do in case of urgent or emergency type symptoms.   Medication list was reconciled, printed and provided to the patient in AVS. Patient instructions and summary information was reviewed with the patient as documented in the AVS. This note was prepared with assistance of Dragon voice recognition software. Occasional wrong-word or sound-a-like substitutions may have occurred due to the inherent limitations of voice recognition software

## 2018-05-21 ENCOUNTER — Encounter: Payer: Self-pay | Admitting: Family Medicine

## 2018-08-27 ENCOUNTER — Encounter: Payer: Self-pay | Admitting: Family Medicine

## 2018-09-20 ENCOUNTER — Encounter: Payer: BLUE CROSS/BLUE SHIELD | Admitting: Family Medicine

## 2018-09-28 ENCOUNTER — Other Ambulatory Visit: Payer: Self-pay

## 2018-09-28 ENCOUNTER — Encounter: Payer: Self-pay | Admitting: Family Medicine

## 2018-09-28 ENCOUNTER — Encounter: Payer: BLUE CROSS/BLUE SHIELD | Admitting: Family Medicine

## 2018-09-28 ENCOUNTER — Ambulatory Visit (INDEPENDENT_AMBULATORY_CARE_PROVIDER_SITE_OTHER): Payer: BLUE CROSS/BLUE SHIELD | Admitting: Family Medicine

## 2018-09-28 VITALS — BP 128/76 | HR 107 | Temp 98.8°F | Resp 16 | Ht 63.0 in | Wt 195.8 lb

## 2018-09-28 DIAGNOSIS — E282 Polycystic ovarian syndrome: Secondary | ICD-10-CM

## 2018-09-28 DIAGNOSIS — Z1159 Encounter for screening for other viral diseases: Secondary | ICD-10-CM | POA: Diagnosis not present

## 2018-09-28 DIAGNOSIS — Z111 Encounter for screening for respiratory tuberculosis: Secondary | ICD-10-CM | POA: Diagnosis not present

## 2018-09-28 DIAGNOSIS — Z20828 Contact with and (suspected) exposure to other viral communicable diseases: Secondary | ICD-10-CM | POA: Diagnosis not present

## 2018-09-28 DIAGNOSIS — Z0000 Encounter for general adult medical examination without abnormal findings: Secondary | ICD-10-CM

## 2018-09-28 DIAGNOSIS — R030 Elevated blood-pressure reading, without diagnosis of hypertension: Secondary | ICD-10-CM | POA: Diagnosis not present

## 2018-09-28 LAB — POCT GLYCOSYLATED HEMOGLOBIN (HGB A1C): Hemoglobin A1C: 5.5 % (ref 4.0–5.6)

## 2018-09-28 LAB — POCT URINALYSIS DIPSTICK
Bilirubin, UA: NEGATIVE
Blood, UA: NEGATIVE
Glucose, UA: NEGATIVE
Ketones, UA: NEGATIVE
Leukocytes, UA: NEGATIVE
Nitrite, UA: NEGATIVE
Protein, UA: NEGATIVE
Spec Grav, UA: 1.015 (ref 1.010–1.025)
Urobilinogen, UA: 0.2 E.U./dL
pH, UA: 7.5 (ref 5.0–8.0)

## 2018-09-28 NOTE — Patient Instructions (Signed)
Please return in 6 months to recheck carpal tunnel, mood and insulin resistance.   If you have any questions or concerns, please don't hesitate to send me a message via MyChart or call the office at [phone]. Thank you for visiting with [place] today! It's our pleasure caring for you.   Preventive Care 18-39 Years, Female Preventive care refers to lifestyle choices and visits with your health care provider that can promote health and wellness. What does preventive care include?   A yearly physical exam. This is also called an annual well check.  Dental exams once or twice a year.  Routine eye exams. Ask your health care provider how often you should have your eyes checked.  Personal lifestyle choices, including: ? Daily care of your teeth and gums. ? Regular physical activity. ? Eating a healthy diet. ? Avoiding tobacco and drug use. ? Limiting alcohol use. ? Practicing safe sex. ? Taking vitamin and mineral supplements as recommended by your health care provider. What happens during an annual well check? The services and screenings done by your health care provider during your annual well check will depend on your age, overall health, lifestyle risk factors, and family history of disease. Counseling Your health care provider may ask you questions about your:  Alcohol use.  Tobacco use.  Drug use.  Emotional well-being.  Home and relationship well-being.  Sexual activity.  Eating habits.  Work and work Statistician.  Method of birth control.  Menstrual cycle.  Pregnancy history. Screening You may have the following tests or measurements:  Height, weight, and BMI.  Diabetes screening. This is done by checking your blood sugar (glucose) after you have not eaten for a while (fasting).  Blood pressure.  Lipid and cholesterol levels. These may be checked every 5 years starting at age 61.  Skin check.  Hepatitis C blood test.  Hepatitis B blood test.   Sexually transmitted disease (STD) testing.  BRCA-related cancer screening. This may be done if you have a family history of breast, ovarian, tubal, or peritoneal cancers.  Pelvic exam and Pap test. This may be done every 3 years starting at age 61. Starting at age 73, this may be done every 5 years if you have a Pap test in combination with an HPV test. Discuss your test results, treatment options, and if necessary, the need for more tests with your health care provider. Vaccines Your health care provider may recommend certain vaccines, such as:  Influenza vaccine. This is recommended every year.  Tetanus, diphtheria, and acellular pertussis (Tdap, Td) vaccine. You may need a Td booster every 10 years.  Varicella vaccine. You may need this if you have not been vaccinated.  HPV vaccine. If you are 7 or younger, you may need three doses over 6 months.  Measles, mumps, and rubella (MMR) vaccine. You may need at least one dose of MMR. You may also need a second dose.  Pneumococcal 13-valent conjugate (PCV13) vaccine. You may need this if you have certain conditions and were not previously vaccinated.  Pneumococcal polysaccharide (PPSV23) vaccine. You may need one or two doses if you smoke cigarettes or if you have certain conditions.  Meningococcal vaccine. One dose is recommended if you are age 32-21 years and a first-year college student living in a residence hall, or if you have one of several medical conditions. You may also need additional booster doses.  Hepatitis A vaccine. You may need this if you have certain conditions or if you travel or  work in places where you may be exposed to hepatitis A.  Hepatitis B vaccine. You may need this if you have certain conditions or if you travel or work in places where you may be exposed to hepatitis B.  Haemophilus influenzae type b (Hib) vaccine. You may need this if you have certain risk factors. Talk to your health care provider about which  screenings and vaccines you need and how often you need them. This information is not intended to replace advice given to you by your health care provider. Make sure you discuss any questions you have with your health care provider. Document Released: 05/31/2001 Document Revised: 11/15/2016 Document Reviewed: 02/03/2015 Elsevier Interactive Patient Education  2019 [employer].

## 2018-09-28 NOTE — Progress Notes (Signed)
Subjective  Chief Complaint  Patient presents with  . Annual Exam    Not fasting  . PCOS    Increased Metformin to 1500    HPI: Leah Moreno is a 38 y.o. female who presents to [facility] today for a Female Wellness Visit.   Wellness Visit: annual visit with health maintenance review and exam without Pap   HM: screening is up to date. She is in the process of adopting a child from [place]: has paperwork that needs to be completed; needs hep b and tb screens as well. She feels good about her decision. She is in a much better place now and is no longer stressed or suffering from IBS sxs.   PCOS: with mild insulin resistance on metformin. Helps to regulate cycles as well.   Assessment  1. Annual physical exam   2. PCOS (polycystic ovarian syndrome)   3. Need for hepatitis B screening test   4. Screening for tuberculosis      Plan  Female Wellness Visit:  Age appropriate Health Maintenance and Prevention measures were discussed with patient. Included topics are cancer screening recommendations, ways to keep healthy (see AVS) including dietary and exercise recommendations, regular eye and dental care, use of seat belts, and avoidance of moderate alcohol use and tobacco use.   BMI: discussed patient's BMI and encouraged positive lifestyle modifications to help get to or maintain a target BMI.  HM needs and immunizations were addressed and ordered. See below for orders. See HM and immunization section for updates.  Routine labs and screening tests ordered including cmp, cbc and lipids where appropriate.  Discussed recommendations regarding Vit D and calcium supplementation (see AVS)  Screens as required for adoption process  Continue metformin.   Follow up: Return in about 6 months (around 03/30/2019) for recheck.   Orders Placed This Encounter  Procedures  . HIV Antibody (routine testing w rflx)  . Hepatitis B Surface AntiGEN  . Lipid panel   . Comprehensive metabolic panel  . CBC with Differential/Platelet  . QuantiFERON-TB Gold Plus  . POCT urinalysis dipstick  . POCT glycosylated hemoglobin (Hb A1C)   No orders of the defined types were placed in this encounter.     Lifestyle: Body mass index is 34.68 kg/m. Wt Readings from Last 3 Encounters:  09/28/18 195 lb 12.8 oz (88.8 kg)  05/18/18 197 lb 9.6 oz (89.6 kg)  04/23/18 195 lb 6.4 oz (88.6 kg)     Patient Active Problem List   Diagnosis Date Noted  . 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 PCOS (polycystic ovarian syndrome)     Dx about 7 yrs ago   . 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 Protein S deficiency complicating pregnancy - delivered 08/05/2011  . Borderline hypertension 08/05/2011   Health Maintenance  Topic Date Due  . INFLUENZA VACCINE  11/17/2018  . PAP SMEAR-Modifier  02/14/2022  . TETANUS/TDAP  04/18/2025  . HIV Screening  Completed   Immunization History  Administered Date(s) Administered  . Influenza,inj,Quad PF,6+ Mos 01/27/2018  . Influenza-Unspecified 02/21/2018   We updated and reviewed the patient's past history in detail and it is documented below. Allergies: Patient is allergic to naproxen and zoloft [sertraline hcl]. Past Medical History Patient  has a past medical history of Allergy, Blood dyscrasia, Borderline hypertension (08/05/2011), Chicken pox, Clotting disorder ([place]), Insulin resistance, PCOS (polycystic ovarian syndrome), Pregnancy  induced hypertension (2013), Protein S deficiency complicating pregnancy - delivered (08/05/2011), and Thrombocytopenia due to blood loss (08/05/2011). Past Surgical History Patient  has a past surgical history that includes IVF; Cesarean section (08/04/2011); and Cesarean section (N/A, 10/01/2014). Family History: Patient family history includes Cancer in her maternal grandfather; Crohn's disease in  her sister; Drug abuse in her father; Early death in her father; Hearing loss in her maternal grandfather; Irritable bowel syndrome in her mother; Mental illness in her maternal grandmother and paternal grandfather; Stroke in her brother. Social History:  Patient  reports that she has never smoked. She has never used smokeless tobacco. She reports that she does not drink alcohol or use drugs.  Review of Systems: Constitutional: negative for fever or malaise Ophthalmic: negative for photophobia, double vision or loss of vision Cardiovascular: negative for chest pain, dyspnea on exertion, or new LE swelling Respiratory: negative for SOB or persistent cough Gastrointestinal: negative for abdominal pain, change in bowel habits or melena Genitourinary: negative for dysuria or gross hematuria, no abnormal uterine bleeding or disharge Musculoskeletal: negative for new gait disturbance or muscular weakness Integumentary: negative for new or persistent rashes, no breast lumps Neurological: negative for TIA or stroke symptoms Psychiatric: negative for SI or delusions Allergic/Immunologic: negative for hives Patient Care Team    Relationship Specialty Notifications Start End  Willow OraAndy, Kaeya Schiffer L, MD PCP - General Family Medicine  06/20/17   Shea EvansMody, Vaishali, MD Consulting Physician Obstetrics and Gynecology  06/20/17     Objective  Vitals: BP 128/76   Pulse (!) 107   Temp 98.8 F (37.1 C) (Oral)   Resp 16   Ht 5\' 3"  (1.6 m)   Wt 195 lb 12.8 oz (88.8 kg)   LMP 09/19/2018   SpO2 98%   BMI 34.68 kg/m  General:  Well developed, well nourished, no acute distress  Psych:  Alert and orientedx3,normal mood and affect HEENT:  Normocephalic, atraumatic, non-icteric sclera, PERRL, oropharynx is clear without mass or exudate, supple neck without adenopathy, mass or thyromegaly Cardiovascular:  Normal S1, S2, RRR without gallop, rub or murmur, nondisplaced PMI Respiratory:  Good breath sounds bilaterally, CTAB  with normal respiratory effort Gastrointestinal: normal bowel sounds, soft, non-tender, no noted masses. No HSM MSK: no deformities, contusions. Joints are without erythema or swelling. Spine and CVA region are nontender Skin:  Warm, no rashes or suspicious lesions noted Neurologic:    Mental status is normal. CN 2-11 are normal. Gross motor and sensory exams are normal. Normal gait. No tremor    Commons side effects, risks, benefits, and alternatives for medications and treatment plan prescribed today were discussed, and the patient expressed understanding of the given instructions. Patient is instructed to call or message via MyChart if he/she has any questions or concerns regarding our treatment plan. No barriers to understanding were identified. We discussed Red Flag symptoms and signs in detail. Patient expressed understanding regarding what to do in case of urgent or emergency type symptoms.   Medication list was reconciled, printed and provided to the patient in AVS. Patient instructions and summary information was reviewed with the patient as documented in the AVS. This note was prepared with assistance of Dragon voice recognition software. Occasional wrong-word or sound-a-like substitutions may have occurred due to the inherent limitations of voice recognition software

## 2018-09-30 ENCOUNTER — Encounter: Payer: Self-pay | Admitting: Family Medicine

## 2018-09-30 LAB — QUANTIFERON-TB GOLD PLUS
Mitogen-NIL: >10 IU/mL
NIL: 0.01 IU/mL
QuantiFERON-TB Gold Plus: NEGATIVE
TB1-NIL: 0 IU/mL
TB2-NIL: 0 IU/mL

## 2018-10-01 ENCOUNTER — Encounter: Payer: Self-pay | Admitting: Family Medicine

## 2018-10-01 LAB — COMPREHENSIVE METABOLIC PANEL
AG Ratio: 1.4 (calc) (ref 1.0–2.5)
ALT: 10 U/L (ref 6–29)
AST: 13 U/L (ref 10–30)
Albumin: 4.4 g/dL (ref 3.6–5.1)
Alkaline phosphatase (APISO): 79 U/L (ref 31–125)
BUN: 12 mg/dL (ref 7–25)
CO2: 25 mmol/L (ref 20–32)
Calcium: 9.5 mg/dL (ref 8.6–10.2)
Chloride: 106 mmol/L (ref 98–110)
Creat: 0.65 mg/dL (ref 0.50–1.10)
Globulin: 3.1 g/dL (calc) (ref 1.9–3.7)
Glucose, Bld: 97 mg/dL (ref 65–99)
Potassium: 4.1 mmol/L (ref 3.5–5.3)
Sodium: 138 mmol/L (ref 135–146)
Total Bilirubin: 0.3 mg/dL (ref 0.2–1.2)
Total Protein: 7.5 g/dL (ref 6.1–8.1)

## 2018-10-01 LAB — LIPID PANEL
Cholesterol: 186 mg/dL (ref <200)
HDL: 40 mg/dL — ABNORMAL LOW (ref >=50)
LDL Cholesterol (Calc): 115 mg/dL (calc) — ABNORMAL HIGH
Non-HDL Cholesterol (Calc): 146 mg/dL (calc) — ABNORMAL HIGH (ref <130)
Total CHOL/HDL Ratio: 4.7 (calc) (ref <5.0)
Triglycerides: 191 mg/dL — ABNORMAL HIGH (ref <150)

## 2018-10-01 LAB — CBC WITH DIFFERENTIAL/PLATELET
Absolute Monocytes: 470 cells/uL (ref 200–950)
Basophils Absolute: 50 cells/uL (ref 0–200)
Basophils Relative: 0.6 %
Eosinophils Absolute: 92 cells/uL (ref 15–500)
Eosinophils Relative: 1.1 %
HCT: 38.5 % (ref 35.0–45.0)
Hemoglobin: 12.5 g/dL (ref 11.7–15.5)
Lymphs Abs: 2100 cells/uL (ref 850–3900)
MCH: 27.6 pg (ref 27.0–33.0)
MCHC: 32.5 g/dL (ref 32.0–36.0)
MCV: 85 fL (ref 80.0–100.0)
MPV: 12 fL (ref 7.5–12.5)
Monocytes Relative: 5.6 %
Neutro Abs: 5687 cells/uL (ref 1500–7800)
Neutrophils Relative %: 67.7 %
Platelets: 325 10*3/uL (ref 140–400)
RBC: 4.53 10*6/uL (ref 3.80–5.10)
RDW: 12.8 % (ref 11.0–15.0)
Total Lymphocyte: 25 %
WBC: 8.4 10*3/uL (ref 3.8–10.8)

## 2018-10-01 LAB — HIV ANTIBODY (ROUTINE TESTING W REFLEX): HIV 1&2 Ab, 4th Generation: NONREACTIVE

## 2018-10-01 LAB — HEPATITIS B SURFACE ANTIGEN: Hepatitis B Surface Ag: NONREACTIVE

## 2018-10-02 NOTE — Progress Notes (Signed)
Please call patient: I have reviewed his/her lab results. Everything looks good. Please compete forms. Call pt to coordinate notarizing forms. Also please add charge sheet so we can capture the charge for these services. Thanks!

## 2018-10-03 ENCOUNTER — Encounter: Payer: Self-pay | Admitting: Family Medicine

## 2018-10-03 MED ORDER — METFORMIN HCL ER (OSM) 500 MG PO TB24: 1000.0000 mg | ORAL_TABLET | Freq: Every day | ORAL | 3 refills | Status: DC

## 2018-10-03 MED ORDER — MOMETASONE FUROATE 0.1 % EX CREA: 1.0000 "application " | TOPICAL_CREAM | Freq: Every day | CUTANEOUS | 2 refills | Status: AC

## 2018-10-04 ENCOUNTER — Encounter: Payer: Self-pay | Admitting: Family Medicine

## 2018-10-04 ENCOUNTER — Encounter: Payer: Self-pay | Admitting: *Deleted

## 2018-10-05 ENCOUNTER — Telehealth: Payer: Self-pay | Admitting: Family Medicine

## 2018-10-05 NOTE — Telephone Encounter (Signed)
Devin with [facility] calling and states that they are needing some clarification on the prescription that was sent over yesterday. States that metformin (FORTAMET) 500 MG (OSM) 24 hr tablet   was sent over, but the patient has been using the extended release and not the osmetric. Would like to know if this is the correct one the patient should be taking. Please advise.  CB#: [phone]

## 2018-10-08 ENCOUNTER — Other Ambulatory Visit: Payer: Self-pay | Admitting: *Deleted

## 2018-10-08 MED ORDER — METFORMIN HCL ER 500 MG PO TB24: 500.0000 mg | ORAL_TABLET | Freq: Every day | ORAL | 1 refills | Status: DC

## 2018-10-09 ENCOUNTER — Encounter: Payer: Self-pay | Admitting: *Deleted

## 2018-10-12 ENCOUNTER — Encounter: Payer: Self-pay | Admitting: Family Medicine

## 2018-10-12 MED ORDER — METFORMIN HCL ER 500 MG PO TB24: 500.0000 mg | ORAL_TABLET | Freq: Three times a day (TID) | ORAL | 1 refills | Status: DC

## 2018-10-29 ENCOUNTER — Other Ambulatory Visit: Payer: BLUE CROSS/BLUE SHIELD

## 2018-10-29 ENCOUNTER — Encounter: Payer: Self-pay | Admitting: Family Medicine

## 2018-10-29 DIAGNOSIS — Z029 Encounter for administrative examinations, unspecified: Secondary | ICD-10-CM

## 2018-10-29 DIAGNOSIS — Z0282 Encounter for adoption services: Secondary | ICD-10-CM

## 2018-10-31 ENCOUNTER — Other Ambulatory Visit: Payer: Self-pay

## 2018-10-31 ENCOUNTER — Other Ambulatory Visit (INDEPENDENT_AMBULATORY_CARE_PROVIDER_SITE_OTHER): Payer: BC Managed Care – PPO

## 2018-10-31 DIAGNOSIS — Z029 Encounter for administrative examinations, unspecified: Secondary | ICD-10-CM | POA: Diagnosis not present

## 2018-10-31 DIAGNOSIS — Z0282 Encounter for adoption services: Secondary | ICD-10-CM

## 2018-11-01 ENCOUNTER — Encounter: Payer: Self-pay | Admitting: *Deleted

## 2018-11-01 LAB — RPR: RPR Ser Ql: NONREACTIVE

## 2019-01-06 ENCOUNTER — Encounter: Payer: Self-pay | Admitting: Family Medicine

## 2019-01-07 ENCOUNTER — Encounter: Payer: Self-pay | Admitting: Family Medicine

## 2019-01-07 MED ORDER — METFORMIN HCL ER (MOD) 1000 MG PO TB24: 1000.0000 mg | ORAL_TABLET | Freq: Two times a day (BID) | ORAL | 3 refills | Status: DC

## 2019-01-15 DIAGNOSIS — R102 Pelvic and perineal pain: Secondary | ICD-10-CM | POA: Diagnosis not present

## 2019-01-15 DIAGNOSIS — Z6836 Body mass index (BMI) 36.0-36.9, adult: Secondary | ICD-10-CM | POA: Diagnosis not present

## 2019-01-15 DIAGNOSIS — E282 Polycystic ovarian syndrome: Secondary | ICD-10-CM | POA: Diagnosis not present

## 2019-01-15 DIAGNOSIS — Z01419 Encounter for gynecological examination (general) (routine) without abnormal findings: Secondary | ICD-10-CM | POA: Diagnosis not present

## 2019-01-29 DIAGNOSIS — E282 Polycystic ovarian syndrome: Secondary | ICD-10-CM | POA: Diagnosis not present

## 2019-01-29 DIAGNOSIS — R1011 Right upper quadrant pain: Secondary | ICD-10-CM | POA: Diagnosis not present

## 2019-03-19 ENCOUNTER — Encounter: Payer: Self-pay | Admitting: Family Medicine

## 2019-03-20 NOTE — Telephone Encounter (Signed)
Please call pt and get scheduled with me for a quick signing of the letter. It will need to be printed on our letterhead?  Can work in at end of am clinic if possible?

## 2019-04-02 ENCOUNTER — Ambulatory Visit: Payer: BLUE CROSS/BLUE SHIELD | Admitting: Family Medicine

## 2019-08-30 ENCOUNTER — Encounter: Payer: Self-pay | Admitting: Family Medicine

## 2019-09-02 ENCOUNTER — Ambulatory Visit: Payer: Self-pay | Attending: Internal Medicine

## 2019-09-02 DIAGNOSIS — Z20822 Contact with and (suspected) exposure to covid-19: Secondary | ICD-10-CM

## 2019-09-03 DIAGNOSIS — Z7184 Encounter for health counseling related to travel: Secondary | ICD-10-CM | POA: Diagnosis not present

## 2019-09-03 LAB — NOVEL CORONAVIRUS, NAA: SARS-CoV-2, NAA: NOT DETECTED

## 2019-09-03 LAB — SARS-COV-2, NAA 2 DAY TAT

## 2019-09-03 NOTE — Telephone Encounter (Signed)
Dr. Mardelle Matte is completely booked for today. Offered patient virtual appt for 09/04/19. Patient declined. Patient would like to know can If Dr. Mardelle Matte will send in medication without virtual appt.

## 2019-09-04 ENCOUNTER — Telehealth: Payer: Self-pay | Admitting: Family Medicine

## 2019-09-30 ENCOUNTER — Encounter: Payer: Self-pay | Admitting: Family Medicine

## 2020-01-08 ENCOUNTER — Ambulatory Visit (INDEPENDENT_AMBULATORY_CARE_PROVIDER_SITE_OTHER): Payer: BC Managed Care – PPO | Admitting: Family Medicine

## 2020-01-08 ENCOUNTER — Other Ambulatory Visit: Payer: Self-pay

## 2020-01-08 ENCOUNTER — Encounter: Payer: Self-pay | Admitting: Family Medicine

## 2020-01-08 VITALS — BP 130/88 | HR 95 | Temp 97.8°F | Resp 17 | Ht 63.0 in | Wt 207.0 lb

## 2020-01-08 DIAGNOSIS — E282 Polycystic ovarian syndrome: Secondary | ICD-10-CM

## 2020-01-08 DIAGNOSIS — R030 Elevated blood-pressure reading, without diagnosis of hypertension: Secondary | ICD-10-CM | POA: Diagnosis not present

## 2020-01-08 DIAGNOSIS — E669 Obesity, unspecified: Secondary | ICD-10-CM

## 2020-01-08 DIAGNOSIS — K580 Irritable bowel syndrome with diarrhea: Secondary | ICD-10-CM

## 2020-01-08 DIAGNOSIS — Z0000 Encounter for general adult medical examination without abnormal findings: Secondary | ICD-10-CM | POA: Diagnosis not present

## 2020-01-08 DIAGNOSIS — Z139 Encounter for screening, unspecified: Secondary | ICD-10-CM | POA: Diagnosis not present

## 2020-01-08 NOTE — Patient Instructions (Signed)
Please return in 12 months for your annual complete physical; please come fasting.  I will release your lab results to you on your MyChart account with further instructions. Please reply with any questions.  Please let us know when you get your flu shot so we may update your chart.   Start checking your blood pressures once in awhile at home and log the readings. Normal is 120s/70s; mildly elevated is 130s/80s and hypertension is if > 140/90. If you are getting high readings, let me know.  Eat a low sodium diet.   If you have any questions or concerns, please don't hesitate to send me a message via MyChart or call the office at [phone]. Thank you for visiting with [place] today! It's our pleasure caring for you.   Low-Sodium Eating Plan Sodium, which is an element that makes up salt, helps you maintain a healthy balance of fluids in your body. Too much sodium can increase your blood pressure and cause fluid and waste to be held in your body. Your health care provider or dietitian may recommend following this plan if you have high blood pressure (hypertension), kidney disease, liver disease, or heart failure. Eating less sodium can help lower your blood pressure, reduce swelling, and protect your heart, liver, and kidneys. What are tips for following this plan? General guidelines  Most people on this plan should limit their sodium intake to 1,500-2,000 mg (milligrams) of sodium each day. Reading food labels   The Nutrition Facts label lists the amount of sodium in one serving of the food. If you eat more than one serving, you must multiply the listed amount of sodium by the number of servings.  Choose foods with less than 140 mg of sodium per serving.  Avoid foods with 300 mg of sodium or more per serving. Shopping  Look for lower-sodium products, often labeled as "low-sodium" or "no salt added."  Always check the sodium content even if foods are labeled as "unsalted" or "no salt  added".  Buy fresh foods. ? Avoid canned foods and premade or frozen meals. ? Avoid canned, cured, or processed meats  Buy breads that have less than 80 mg of sodium per slice. Cooking  Eat more home-cooked food and less restaurant, buffet, and fast food.  Avoid adding salt when cooking. Use salt-free seasonings or herbs instead of table salt or sea salt. Check with your health care provider or pharmacist before using salt substitutes.  Cook with plant-based oils, such as canola, sunflower, or olive oil. Meal planning  When eating at a restaurant, ask that your food be prepared with less salt or no salt, if possible.  Avoid foods that contain MSG (monosodium glutamate). MSG is sometimes added to [place] food, bouillon, and some canned foods. What foods are recommended? The items listed may not be a complete list. Talk with your dietitian about what dietary choices are best for you. Grains Low-sodium cereals, including oats, puffed wheat and rice, and shredded wheat. Low-sodium crackers. Unsalted rice. Unsalted pasta. Low-sodium bread. Whole-grain breads and whole-grain pasta. Vegetables Fresh or frozen vegetables. "No salt added" canned vegetables. "No salt added" tomato sauce and paste. Low-sodium or reduced-sodium tomato and vegetable juice. Fruits Fresh, frozen, or canned fruit. Fruit juice. Meats and other protein foods Fresh or frozen (no salt added) meat, poultry, seafood, and fish. Low-sodium canned tuna and salmon. Unsalted nuts. Dried peas, beans, and lentils without added salt. Unsalted canned beans. Eggs. Unsalted nut butters. Dairy Milk. Soy milk. Cheese that  is naturally low in sodium, such as ricotta cheese, fresh mozzarella, or Swiss cheese Low-sodium or reduced-sodium cheese. Cream cheese. Yogurt. Fats and oils Unsalted butter. Unsalted margarine with no trans fat. Vegetable oils such as canola or olive oils. Seasonings and other foods Fresh and dried herbs and  spices. Salt-free seasonings. Low-sodium mustard and ketchup. Sodium-free salad dressing. Sodium-free light mayonnaise. Fresh or refrigerated horseradish. Lemon juice. Vinegar. Homemade, reduced-sodium, or low-sodium soups. Unsalted popcorn and pretzels. Low-salt or salt-free chips. What foods are not recommended? The items listed may not be a complete list. Talk with your dietitian about what dietary choices are best for you. Grains Instant hot cereals. Bread stuffing, pancake, and biscuit mixes. Croutons. Seasoned rice or pasta mixes. Noodle soup cups. Boxed or frozen macaroni and cheese. Regular salted crackers. Self-rising flour. Vegetables Sauerkraut, pickled vegetables, and relishes. Olives. [place] fries. Onion rings. Regular canned vegetables (not low-sodium or reduced-sodium). Regular canned tomato sauce and paste (not low-sodium or reduced-sodium). Regular tomato and vegetable juice (not low-sodium or reduced-sodium). Frozen vegetables in sauces. Meats and other protein foods Meat or fish that is salted, canned, smoked, spiced, or pickled. Bacon, ham, sausage, hotdogs, corned beef, chipped beef, packaged lunch meats, salt pork, jerky, pickled herring, anchovies, regular canned tuna, sardines, salted nuts. Dairy Processed cheese and cheese spreads. Cheese curds. Blue cheese. Feta cheese. String cheese. Regular cottage cheese. Buttermilk. Canned milk. Fats and oils Salted butter. Regular margarine. Ghee. Bacon fat. Seasonings and other foods Onion salt, garlic salt, seasoned salt, table salt, and sea salt. Canned and packaged gravies. Worcestershire sauce. Tartar sauce. Barbecue sauce. Teriyaki sauce. Soy sauce, including reduced-sodium. Steak sauce. Fish sauce. Oyster sauce. Cocktail sauce. Horseradish that you find on the shelf. Regular ketchup and mustard. Meat flavorings and tenderizers. Bouillon cubes. Hot sauce and Tabasco sauce. Premade or packaged marinades. Premade or packaged taco  seasonings. Relishes. Regular salad dressings. Salsa. Potato and tortilla chips. Corn chips and puffs. Salted popcorn and pretzels. Canned or dried soups. Pizza. Frozen entrees and pot pies. Summary  Eating less sodium can help lower your blood pressure, reduce swelling, and protect your heart, liver, and kidneys.  Most people on this plan should limit their sodium intake to 1,500-2,000 mg (milligrams) of sodium each day.  Canned, boxed, and frozen foods are high in sodium. Restaurant foods, fast foods, and pizza are also very high in sodium. You also get sodium by adding salt to food.  Try to cook at home, eat more fresh fruits and vegetables, and eat less fast food, canned, processed, or prepared foods. This information is not intended to replace advice given to you by your health care provider. Make sure you discuss any questions you have with your health care provider. Document Revised: 03/17/2017 Document Reviewed: 03/28/2016 Elsevier Patient Education  2020 Else[employer] Inc.  Preventive Care 59-37 Years Old, Female Preventive care refers to visits with your health care provider and lifestyle choices that can promote health and wellness. This includes:  A yearly physical exam. This may also be called an annual well check.  Regular dental visits and eye exams.  Immunizations.  Screening for certain conditions.  Healthy lifestyle choices, such as eating a healthy diet, getting regular exercise, not using drugs or products that contain nicotine and tobacco, and limiting alcohol use. What can I expect for my preventive care visit? Physical exam Your health care provider will check your:  Height and weight. This may be used to calculate body mass index (BMI), which tells if you  are at a healthy weight.  Heart rate and blood pressure.  Skin for abnormal spots. Counseling Your health care provider may ask you questions about your:  Alcohol, tobacco, and drug use.  Emotional  well-being.  Home and relationship well-being.  Sexual activity.  Eating habits.  Work and work Statistician.  Method of birth control.  Menstrual cycle.  Pregnancy history. What immunizations do I need?  Influenza (flu) vaccine  This is recommended every year. Tetanus, diphtheria, and pertussis (Tdap) vaccine  You may need a Td booster every 10 years. Varicella (chickenpox) vaccine  You may need this if you have not been vaccinated. Zoster (shingles) vaccine  You may need this after age 78. Measles, mumps, and rubella (MMR) vaccine  You may need at least one dose of MMR if you were born in 1957 or later. You may also need a second dose. Pneumococcal conjugate (PCV13) vaccine  You may need this if you have certain conditions and were not previously vaccinated. Pneumococcal polysaccharide (PPSV23) vaccine  You may need one or two doses if you smoke cigarettes or if you have certain conditions. Meningococcal conjugate (MenACWY) vaccine  You may need this if you have certain conditions. Hepatitis A vaccine  You may need this if you have certain conditions or if you travel or work in places where you may be exposed to hepatitis A. Hepatitis B vaccine  You may need this if you have certain conditions or if you travel or work in places where you may be exposed to hepatitis B. Haemophilus influenzae type b (Hib) vaccine  You may need this if you have certain conditions. Human papillomavirus (HPV) vaccine  If recommended by your health care provider, you may need three doses over 6 months. You may receive vaccines as individual doses or as more than one vaccine together in one shot (combination vaccines). Talk with your health care provider about the risks and benefits of combination vaccines. What tests do I need? Blood tests  Lipid and cholesterol levels. These may be checked every 5 years, or more frequently if you are over 75 years old.  Hepatitis C  test.  Hepatitis B test. Screening  Lung cancer screening. You may have this screening every year starting at age 11 if you have a 30-pack-year history of smoking and currently smoke or have quit within the past 15 years.  Colorectal cancer screening. All adults should have this screening starting at age 43 and continuing until age 33. Your health care provider may recommend screening at age 57 if you are at increased risk. You will have tests every 1-10 years, depending on your results and the type of screening test.  Diabetes screening. This is done by checking your blood sugar (glucose) after you have not eaten for a while (fasting). You may have this done every 1-3 years.  Mammogram. This may be done every 1-2 years. Talk with your health care provider about when you should start having regular mammograms. This may depend on whether you have a family history of breast cancer.  BRCA-related cancer screening. This may be done if you have a family history of breast, ovarian, tubal, or peritoneal cancers.  Pelvic exam and Pap test. This may be done every 3 years starting at age 11. Starting at age 51, this may be done every 5 years if you have a Pap test in combination with an HPV test. Other tests  Sexually transmitted disease (STD) testing.  Bone density scan. This is done to  screen for osteoporosis. You may have this scan if you are at high risk for osteoporosis. Follow these instructions at home: Eating and drinking  Eat a diet that includes fresh fruits and vegetables, whole grains, lean protein, and low-fat dairy.  Take vitamin and mineral supplements as recommended by your health care provider.  Do not drink alcohol if: ? Your health care provider tells you not to drink. ? You are pregnant, may be pregnant, or are planning to become pregnant.  If you drink alcohol: ? Limit how much you have to 0-1 drink a day. ? Be aware of how much alcohol is in your drink. In the [place] one  drink equals one 12 oz bottle of beer (355 mL), one 5 oz glass of wine (148 mL), or one 1 oz glass of hard liquor (44 mL). Lifestyle  Take daily care of your teeth and gums.  Stay active. Exercise for at least 30 minutes on 5 or more days each week.  Do not use any products that contain nicotine or tobacco, such as cigarettes, e-cigarettes, and chewing tobacco. If you need help quitting, ask your health care provider.  If you are sexually active, practice safe sex. Use a condom or other form of birth control (contraception) in order to prevent pregnancy and STIs (sexually transmitted infections).  If told by your health care provider, take low-dose aspirin daily starting at age 26. What's next?  Visit your health care provider once a year for a well check visit.  Ask your health care provider how often you should have your eyes and teeth checked.  Stay up to date on all vaccines. This information is not intended to replace advice given to you by your health care provider. Make sure you discuss any questions you have with your health care provider. Document Revised: 12/14/2017 Document Reviewed: 12/14/2017 Elsevier Patient Education  2020 [employer].

## 2020-01-08 NOTE — Progress Notes (Signed)
Subjective  Chief Complaint  Patient presents with  . Annual Exam    non-fasting    HPI: Leah Moreno is a 39 y.o. female who presents to [facility] [place] today for a Female Wellness Visit.  She also has the concerns and/or needs as listed above in the chief complaint. These will be addressed in addition to the Health Maintenance Visit.   Wellness Visit: annual visit with health maintenance review and exam without Pap   HM: screens up to date. rec eye exam. Defers flu until later in season. Diet has been a little off and weight is up a little: adopted [place] from [place] and she is doing very well. Pt is a busy mother of 4 children; home schooling. Happy.  Chronic disease management visit and/or acute problem visit:  History of hypertension pregnancy with borderline reading today.  She admits to increased stress with managing 4 children in a busy household and home schooling.  However she feels fine.  She has not had high blood pressure in the last several years by my check.  She denies chest pain shortness of breath lower extremity edema.  She has gained a little weight.  Diet has been off since traveling.  PCOS is managed by GYN and she continues on Metformin.  The dose has been increased.  She is tolerating Metformin.  Is not exacerbated her IBS.  Assessment  1. Annual physical exam   2. PCOS (polycystic ovarian syndrome)   3. Irritable bowel syndrome with diarrhea   4. Obesity (BMI 30-39.9)   5. Prehypertension      Plan  Female Wellness Visit:  Age appropriate Health Maintenance and Prevention measures were discussed with patient. Included topics are cancer screening recommendations, ways to keep healthy (see AVS) including dietary and exercise recommendations, regular eye and dental care, use of seat belts, and avoidance of moderate alcohol use and tobacco use.  Screens are up-to-date  BMI: discussed patient's BMI and encouraged positive lifestyle  modifications to help get to or maintain a target BMI.  HM needs and immunizations were addressed and ordered. See below for orders. See HM and immunization section for updates.  Patient will get flu shot later this season.  Routine labs and screening tests ordered including cmp, cbc and lipids where appropriate.  Discussed recommendations regarding Vit D and calcium supplementation (see AVS)  Chronic disease f/u and/or acute problem visit: (deemed necessary to be done in addition to the wellness visit):  Prehypertension: Patient is at risk due to weight and history of hypertension pregnancy.  Blood pressure in the office was improved after recheck.  Recommend home monitoring and follow-up if remains elevated  PCOS: Tolerating medications.  Obesity: We will work on weight loss.  IBS: Intermittent but currently controlled  Follow up: Return in about 1 year (around 01/07/2021) for complete physical.   Orders Placed This Encounter  Procedures  . CBC with Differential/Platelet  . COMPLETE METABOLIC PANEL WITH GFR  . Hemoglobin A1c  . Lipid panel  . TSH   No orders of the defined types were placed in this encounter.     Lifestyle: Body mass index is 36.67 kg/m. Wt Readings from Last 3 Encounters:  01/08/20 207 lb (93.9 kg)  09/28/18 195 lb 12.8 oz (88.8 kg)  05/18/18 197 lb 9.6 oz (89.6 kg)   BP Readings from Last 3 Encounters:  01/08/20 130/88  09/28/18 128/76  05/18/18 132/84     Patient Active Problem List  Diagnosis Date Noted  . Obesity (BMI 30-39.9) 01/08/2020  . 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 PCOS (polycystic ovarian syndrome)     Dx about 7 yrs ago   . 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 Prehypertension 08/05/2011   Health Maintenance  Topic Date Due  . Hepatitis C Screening  Never done  . INFLUENZA VACCINE  02/07/2020 (Originally 11/17/2019)  . PAP  SMEAR-Modifier  02/14/2022  . TETANUS/TDAP  04/18/2025  . COVID-19 Vaccine  Completed  . HIV Screening  Completed   Immunization History  Administered Date(s) Administered  . Influenza,inj,Quad PF,6+ Mos 01/27/2018  . Influenza-Unspecified 02/21/2018, 03/23/2019  . PFIZER SARS-COV-2 Vaccination 07/08/2019, 07/29/2019   We updated and reviewed the patient's past history in detail and it is documented below. Allergies: Patient  reports no history of alcohol use. Past Medical History Patient  has a past medical history of Allergy, Blood dyscrasia, Borderline hypertension (08/05/2011), Chicken pox, Clotting disorder (HCC), Insulin resistance, PCOS (polycystic ovarian syndrome), Pregnancy induced hypertension (2013), Protein S deficiency complicating pregnancy - delivered (08/05/2011), and Thrombocytopenia due to blood loss (08/05/2011). Past Surgical History Patient  has a past surgical history that includes IVF; Cesarean section (08/04/2011); and Cesarean section (N/A, 10/01/2014). Social History   Socioeconomic History  . Marital status: Married    Spouse name: Not on file  . Number of children: 4  . Years of education: Not on file  . Highest education level: Not on file  Occupational History  . Occupation: homemaker  Tobacco Use  . Smoking status: Never Smoker  . Smokeless tobacco: Never Used  Vaping Use  . Vaping Use: Never used  Substance and Sexual Activity  . Alcohol use: No  . Drug use: No  . Sexual activity: Yes    Birth control/protection: None  Other Topics Concern  . Not on file  Social History Narrative   Has 6 siblings; family now in [place]. Married homemaker, mother of 3 boys (twins, singleton) and adopted daughter from [place] 2021; husband is managing partner of law firm   Social Determinants of Health   Financial Resource Strain:   . Difficulty of Paying Living Expenses: Not on file  Food Insecurity:   . Worried About Programme researcher, broadcasting/film/video in the Last Year:  Not on file  . Ran Out of Food in the Last Year: Not on file  Transportation Needs:   . Lack of Transportation (Medical): Not on file  . Lack of Transportation (Non-Medical): Not on file  Physical Activity:   . Days of Exercise per Week: Not on file  . Minutes of Exercise per Session: Not on file  Stress:   . Feeling of Stress : Not on file  Social Connections:   . Frequency of Communication with Friends and Family: Not on file  . Frequency of Social Gatherings with Friends and Family: Not on file  . Attends Religious Services: Not on file  . Active Member of Clubs or Organizations: Not on file  . Attends Banker Meetings: Not on file  . Marital Status: Not on file   Family History  Problem Relation Age of Onset  . Irritable bowel syndrome Mother   . Hypertension Mother   . Drug abuse Father   . Early death Father   . Stroke Brother   . Mental illness Maternal Grandmother   . Cancer Maternal Grandfather   . Hearing loss Maternal Grandfather   .  Mental illness Paternal Grandfather   . Crohn's disease Sister     Review of Systems: Constitutional: negative for fever or malaise Ophthalmic: negative for photophobia, double vision or loss of vision Cardiovascular: negative for chest pain, dyspnea on exertion, or new LE swelling Respiratory: negative for SOB or persistent cough Gastrointestinal: negative for abdominal pain, change in bowel habits or melena Genitourinary: negative for dysuria or gross hematuria, no abnormal uterine bleeding or disharge Musculoskeletal: negative for new gait disturbance or muscular weakness Integumentary: negative for new or persistent rashes, no breast lumps Neurological: negative for TIA or stroke symptoms Psychiatric: negative for SI or delusions Allergic/Immunologic: negative for hives  Patient Care Team    Relationship Specialty Notifications Start End  Willow Ora, MD PCP - General Family Medicine  06/20/17   Shea Evans, MD Consulting Physician Obstetrics and Gynecology  06/20/17     Objective  Vitals: BP 130/88   Pulse 95   Temp 97.8 F (36.6 C) (Temporal)   Resp 17   Ht 5\' 3"  (1.6 m)   Wt 207 lb (93.9 kg)   SpO2 98%   BMI 36.67 kg/m  General:  Well developed, well nourished, no acute distress  Psych:  Alert and orientedx3,normal mood and affect HEENT:  Normocephalic, atraumatic, non-icteric sclera, PERRL, supple neck without adenopathy, mass or thyromegaly Cardiovascular:  Normal S1, S2, RRR without gallop, rub or murmur Respiratory:  Good breath sounds bilaterally, CTAB with normal respiratory effort Gastrointestinal: normal bowel sounds, soft, non-tender, no noted masses. No HSM MSK: no deformities, contusions. Joints are without erythema or swelling.  Skin:  Warm, no rashes or suspicious lesions noted Neurologic:    Mental status is normal. Gross motor and sensory exams are normal. Normal gait. No tremor Breast Exam: No mass, skin retraction or nipple discharge is appreciated in either breast. No axillary adenopathy. Fibrocystic changes are not noted     Commons side effects, risks, benefits, and alternatives for medications and treatment plan prescribed today were discussed, and the patient expressed understanding of the given instructions. Patient is instructed to call or message via MyChart if he/she has any questions or concerns regarding our treatment plan. No barriers to understanding were identified. We discussed Red Flag symptoms and signs in detail. Patient expressed understanding regarding what to do in case of urgent or emergency type symptoms.   Medication list was reconciled, printed and provided to the patient in AVS. Patient instructions and summary information was reviewed with the patient as documented in the AVS. This note was prepared with assistance of Dragon voice recognition software. Occasional wrong-word or sound-a-like substitutions may have occurred due to the  inherent limitations of voice recognition software  This visit occurred during the SARS-CoV-2 public health emergency.  Safety protocols were in place, including screening questions prior to the visit, additional usage of staff PPE, and extensive cleaning of exam room while observing appropriate contact time as indicated for disinfecting solutions.

## 2020-01-09 LAB — COMPLETE METABOLIC PANEL WITH GFR
AG Ratio: 1.3 (calc) (ref 1.0–2.5)
ALT: 14 U/L (ref 6–29)
AST: 15 U/L (ref 10–30)
Albumin: 4.2 g/dL (ref 3.6–5.1)
Alkaline phosphatase (APISO): 61 U/L (ref 31–125)
BUN: 16 mg/dL (ref 7–25)
CO2: 26 mmol/L (ref 20–32)
Calcium: 9.3 mg/dL (ref 8.6–10.2)
Chloride: 103 mmol/L (ref 98–110)
Creat: 0.6 mg/dL (ref 0.50–1.10)
GFR, Est African American: 132 mL/min/{1.73_m2} (ref >=60)
GFR, Est Non African American: 114 mL/min/{1.73_m2} (ref >=60)
Globulin: 3.2 g/dL (calc) (ref 1.9–3.7)
Glucose, Bld: 79 mg/dL (ref 65–99)
Potassium: 4 mmol/L (ref 3.5–5.3)
Sodium: 136 mmol/L (ref 135–146)
Total Bilirubin: 0.2 mg/dL (ref 0.2–1.2)
Total Protein: 7.4 g/dL (ref 6.1–8.1)

## 2020-01-09 LAB — LIPID PANEL
Cholesterol: 177 mg/dL (ref <200)
HDL: 41 mg/dL — ABNORMAL LOW (ref >=50)
LDL Cholesterol (Calc): 102 mg/dL (calc) — ABNORMAL HIGH
Non-HDL Cholesterol (Calc): 136 mg/dL (calc) — ABNORMAL HIGH (ref <130)
Total CHOL/HDL Ratio: 4.3 (calc) (ref <5.0)
Triglycerides: 216 mg/dL — ABNORMAL HIGH (ref <150)

## 2020-01-09 LAB — CBC WITH DIFFERENTIAL/PLATELET
Absolute Monocytes: 670 cells/uL (ref 200–950)
Basophils Absolute: 37 cells/uL (ref 0–200)
Basophils Relative: 0.4 %
Eosinophils Absolute: 102 cells/uL (ref 15–500)
Eosinophils Relative: 1.1 %
HCT: 38.8 % (ref 35.0–45.0)
Hemoglobin: 12.5 g/dL (ref 11.7–15.5)
Lymphs Abs: 2651 cells/uL (ref 850–3900)
MCH: 27.9 pg (ref 27.0–33.0)
MCHC: 32.2 g/dL (ref 32.0–36.0)
MCV: 86.6 fL (ref 80.0–100.0)
MPV: 11.3 fL (ref 7.5–12.5)
Monocytes Relative: 7.2 %
Neutro Abs: 5840 cells/uL (ref 1500–7800)
Neutrophils Relative %: 62.8 %
Platelets: 346 10*3/uL (ref 140–400)
RBC: 4.48 10*6/uL (ref 3.80–5.10)
RDW: 13.4 % (ref 11.0–15.0)
Total Lymphocyte: 28.5 %
WBC: 9.3 10*3/uL (ref 3.8–10.8)

## 2020-01-09 LAB — TSH: TSH: 1.51 mIU/L

## 2020-01-09 LAB — HEMOGLOBIN A1C
Hgb A1c MFr Bld: 5.5 % of total Hgb (ref <5.7)
Mean Plasma Glucose: 111 (calc)
eAG (mmol/L): 6.2 (calc)

## 2020-02-13 DIAGNOSIS — Z6836 Body mass index (BMI) 36.0-36.9, adult: Secondary | ICD-10-CM | POA: Diagnosis not present

## 2020-02-13 DIAGNOSIS — Z1151 Encounter for screening for human papillomavirus (HPV): Secondary | ICD-10-CM | POA: Diagnosis not present

## 2020-02-13 DIAGNOSIS — Z01419 Encounter for gynecological examination (general) (routine) without abnormal findings: Secondary | ICD-10-CM | POA: Diagnosis not present

## 2020-02-13 DIAGNOSIS — E282 Polycystic ovarian syndrome: Secondary | ICD-10-CM | POA: Diagnosis not present

## 2020-02-13 DIAGNOSIS — Z1231 Encounter for screening mammogram for malignant neoplasm of breast: Secondary | ICD-10-CM | POA: Diagnosis not present

## 2020-06-23 ENCOUNTER — Ambulatory Visit (INDEPENDENT_AMBULATORY_CARE_PROVIDER_SITE_OTHER): Payer: BC Managed Care – PPO | Admitting: Physician Assistant

## 2020-06-23 ENCOUNTER — Telehealth: Payer: Self-pay

## 2020-06-23 ENCOUNTER — Other Ambulatory Visit: Payer: Self-pay

## 2020-06-23 ENCOUNTER — Encounter: Payer: Self-pay | Admitting: Physician Assistant

## 2020-06-23 VITALS — BP 138/80 | HR 105 | Temp 97.9°F | Ht 63.0 in | Wt 205.2 lb

## 2020-06-23 DIAGNOSIS — I10 Essential (primary) hypertension: Secondary | ICD-10-CM

## 2020-06-23 MED ORDER — AMLODIPINE BESYLATE 2.5 MG PO TABS
2.5000 mg | ORAL_TABLET | Freq: Every day | ORAL | 1 refills | Status: DC
Start: 2020-06-23 — End: 2020-10-13

## 2020-06-23 NOTE — Progress Notes (Signed)
Leah Moreno is a 40 y.o. female here for a follow up of a pre-existing problem.  I acted as a Neurosurgeon for [employer], PA-C Corky Mull, LPN   History of Present Illness:   Chief Complaint  Patient presents with  . Hypertension    HPI   Hypertension Pt has been monitoring her blood pressure since her last visit with Dr. Mardelle Matte on 01/08/2020.  She has had two office visits with other providers since that visit that documented BP with a 100 diastolic and 90 diastolic. She has concerns about her blood pressure being elevated.  She had a headache yesterday (not severe, not "worst of life", no thunderclap onset, no double vision) and BP was 170/120 and then she rested, and rechecked and got 158/110. This morning it is 140/115 with slight headache, did not feel well yesterday, she was walking out side and she felt like she was "off". Had some fatigue as well. Denies dizziness, chest pain, palpitations, SOB or lower leg edema.  BP Readings from Last 3 Encounters:  06/23/20 138/80  01/08/20 130/88  09/28/18 128/76     Past Medical History:  Diagnosis Date  . Allergy   . Blood dyscrasia    Protein S deficient, prothrombing gene factor 2  . Borderline hypertension 08/05/2011  . Chicken pox    around 1987  . 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 Insulin resistance    takes Metformin- has PCOS  . PCOS (polycystic ovarian syndrome)    Dx about 7 yrs ago  . Pregnancy induced hypertension 2013  . Protein S deficiency complicating pregnancy - delivered 08/05/2011  . Thrombocytopenia due to blood loss 08/05/2011     Social History   Tobacco Use  . Smoking status: Never Smoker  . Smokeless tobacco: Never Used  Vaping Use  . Vaping Use: Never used  Substance Use Topics  . Alcohol use: No  . Drug use: No    Past Surgical History:  Procedure Laterality Date  . CESAREAN SECTION  08/04/2011   Procedure: CESAREAN SECTION;  Surgeon: Robley Fries,  MD;  Location: [facility];  Service: Gynecology;  Laterality: N/A;  Twins  EDD: 08/19/11  . CESAREAN SECTION N/A 10/01/2014   Procedure: Repeat CESAREAN SECTION;  Surgeon: Shea Evans, MD;  Location: [facility];  Service: Obstetrics;  Laterality: N/A;  EDD: 10/12/14   . IVF     egg retrieval    Family History  Problem Relation Age of Onset  . Irritable bowel syndrome Mother   . Hypertension Mother   . Drug abuse Father   . Early death Father   . Stroke Brother   . Mental illness Maternal Grandmother   . Cancer Maternal Grandfather   . Hearing loss Maternal Grandfather   . Mental illness Paternal Grandfather   . Crohn's disease Sister     Allergies  Allergen Reactions  . Naproxen     GI upset  . Zoloft [Sertraline Hcl]     Current Medications:   Current Outpatient Medications:  .  amLODipine (NORVASC) 2.5 MG tablet, Take 1 tablet (2.5 mg total) by mouth daily., Disp: 30 tablet, Rfl: 1 .  aspirin EC 81 MG tablet, Take 81 mg by mouth daily., Disp: , Rfl:  .  diphenhydrAMINE (BENADRYL) 25 MG tablet, Take 25 mg by mouth at bedtime., Disp: , Rfl:  .  loratadine (CLARITIN) 10 MG tablet, Take 10 mg by mouth daily., Disp: , Rfl:  .  metFORMIN (GLUMETZA) 1000 MG (MOD) 24 hr tablet, Take 1 tablet (1,000 mg total) by mouth 2 (two) times daily. (Patient taking differently: Take 1,000 mg by mouth daily.), Disp: 180 tablet, Rfl: 3 .  mometasone (ELOCON) 0.1 % cream, Apply 1 application topically daily. (Patient taking differently: Apply 1 application topically as needed.), Disp: 45 g, Rfl: 2 .  polyethylene glycol (MIRALAX / GLYCOLAX) packet, Take 17 g by mouth daily as needed for mild constipation., Disp: , Rfl:    Review of Systems:   ROS Negative unless otherwise specified per HPI.  Vitals:   Vitals:   06/23/20 1207  BP: 138/80  Pulse: (!) 105  Temp: 97.9 F (36.6 C)  TempSrc: Temporal  SpO2: 100%  Weight: 205 lb 4 oz (93.1 kg)  Height: 5\' 3"  (1.6 m)     Body mass index is 36.36  kg/m.  Physical Exam:   Physical Exam Vitals and nursing note reviewed.  Constitutional:      General: She is not in acute distress.    Appearance: She is well-developed. She is not ill-appearing, toxic-appearing or sickly-appearing.  Cardiovascular:     Rate and Rhythm: Normal rate and regular rhythm.     Pulses: Normal pulses.     Heart sounds: Normal heart sounds, S1 normal and S2 normal.     Comments: No LE edema Pulmonary:     Effort: Pulmonary effort is normal.     Breath sounds: Normal breath sounds.  Skin:    General: Skin is warm, dry and intact.  Neurological:     Mental Status: She is alert.     GCS: GCS eye subscore is 4. GCS verbal subscore is 5. GCS motor subscore is 6.  Psychiatric:        Mood and Affect: Mood and affect normal.        Speech: Speech normal.        Behavior: Behavior normal. Behavior is cooperative.     Assessment and Plan:   Leah Moreno was seen today for hypertension.  Diagnoses and all orders for this visit:  Hypertension, unspecified type EKG tracing is personally reviewed.  EKG notes NSR.  No acute changes.  BP today technically within reasonable goal, however after shared decision making with patient, she would like to trial a low dose of medication given recent increase in bp at home and at other doctors visits. I don't think this is unreasonable. Start 2.5 mg norvasc daily. Side effects reviewed with patient. Follow-up in 1-2 months, sooner if concerns. BP log provided, low salt diet encouraged. If any new or worsening symptoms recommend follow-up. -     EKG 12-Lead  Other orders -     amLODipine (NORVASC) 2.5 MG tablet; Take 1 tablet (2.5 mg total) by mouth daily.  CMA or LPN served as scribe during this visit. History, Physical, and Plan performed by medical provider. The above documentation has been reviewed and is accurate and complete.  , PA-C

## 2020-06-23 NOTE — Telephone Encounter (Signed)
Nurse Assessment Nurse: Vear Clock, RN, Elease Hashimoto Date/Time Lamount Cohen Time): 06/23/2020 8:17:01 AM Confirm and document reason for call. If symptomatic, describe symptoms. ---At her last annual her blood pressure was high. She was monitoring it. Last night she had a headache and BP was 170/120 and then she rested. 158/110 This morning it is 140/115 with slight headache, did not feel yesterday, she was walking out side and she felt like she was "off" Does the patient have any new or worsening symptoms? ---Yes Will a triage be completed? ---Yes Related visit to physician within the last 2 weeks? ---No Does the PT have any chronic conditions? (i.e. diabetes, asthma, this includes High risk factors for pregnancy, etc.) ---Yes List chronic conditions. ---hypertension, PCOS, immune dx Is the patient pregnant or possibly pregnant? (Ask all females between the ages of 54-55) ---No Is this a behavioral health or substance abuse call? ---No Guidelines Guideline Title Affirmed Question Affirmed Notes Nurse Date/Time (Eastern Time) Blood Pressure - High Systolic BP >= 180 OR Diastolic >= [address], RN, Elease Hashimoto 06/23/2020 8:20:03 AM Disp. Time Lamount Cohen Time) Disposition Final User PLEASE NOTE: All timestamps contained within this report are represented as [place] Standard Time. CONFIDENTIALTY NOTICE: This fax transmission is intended only for the addressee. It contains information that is legally privileged, confidential or otherwise protected from use or disclosure. If you are not the intended recipient, you are strictly prohibited from reviewing, disclosing, copying using or disseminating any of this information or taking any action in reliance on or regarding this information. If you have received this fax in error, please notify us immediately by telephone so that we can arrange for its return to [place]. Phone: [phone], Toll-Free: [phone], Fax: [phone] Page: 2 of 2 Call Id:  [id number] 06/23/2020 8:23:39 AM See PCP within 24 Hours Yes Vear Clock, RN, Ancil Boozer Disagree/Comply Comply Caller Understands Yes PreDisposition Call Doctor Care Advice Given Per Guideline SEE PCP WITHIN 24 HOURS: CALL BACK IF: CARE ADVICE given per High Blood Pressure (Adult) guideline. * You become worse Referrals REFERRED TO PCP OFFIC

## 2020-06-23 NOTE — Patient Instructions (Signed)
It was great to see you!  130/80 is my goal for you. Start 2.5 mg amlodopine. Keep track of your blood pressures for [place].  Follow-up in a month or so, sooner if needed.    Take care,  Jarold Motto PA-C

## 2020-06-23 NOTE — Telephone Encounter (Signed)
FYI

## 2020-07-28 ENCOUNTER — Other Ambulatory Visit: Payer: Self-pay

## 2020-07-28 ENCOUNTER — Ambulatory Visit (INDEPENDENT_AMBULATORY_CARE_PROVIDER_SITE_OTHER): Payer: BC Managed Care – PPO | Admitting: Family Medicine

## 2020-07-28 ENCOUNTER — Encounter: Payer: Self-pay | Admitting: Family Medicine

## 2020-07-28 VITALS — BP 144/92 | HR 105 | Temp 98.0°F | Ht 63.0 in | Wt 202.8 lb

## 2020-07-28 DIAGNOSIS — R030 Elevated blood-pressure reading, without diagnosis of hypertension: Secondary | ICD-10-CM

## 2020-07-28 DIAGNOSIS — E282 Polycystic ovarian syndrome: Secondary | ICD-10-CM | POA: Diagnosis not present

## 2020-07-28 LAB — BASIC METABOLIC PANEL
BUN: 15 mg/dL (ref 6–23)
CO2: 27 mEq/L (ref 19–32)
Calcium: 9.2 mg/dL (ref 8.4–10.5)
Chloride: 104 mEq/L (ref 96–112)
Creatinine, Ser: 0.69 mg/dL (ref 0.40–1.20)
GFR: 107.72 mL/min (ref >60.00)
Glucose, Bld: 81 mg/dL (ref 70–99)
Potassium: 4.5 mEq/L (ref 3.5–5.1)
Sodium: 138 mEq/L (ref 135–145)

## 2020-07-28 NOTE — Patient Instructions (Addendum)
Please follow up as scheduled for your next visit with me: 01/08/2021 for your complete physical.   We want your blood pressure to be < 140/90.   I will release your lab results to you on your MyChart account with further instructions. Please reply with any questions.   If you have any questions or concerns, please don't hesitate to send me a message via MyChart or call the office at [phone]. Thank you for visiting with [place] today! It's our pleasure caring for you.

## 2020-07-28 NOTE — Progress Notes (Signed)
Subjective  CC:  Chief Complaint  Patient presents with  . Hypertension    Denies starting Amlodipine 2.5 mg that Lelon Mast previously prescribed. Brought at home readings. 120's-130's/80's     HPI: Leah Moreno is a 40 y.o. female who presents to the office today to address the problems listed above in the chief complaint.  40 year old with obesity and prehypertension, history of preeclampsia with pregnancy and strong family history of hypertension presents for follow-up.  I reviewed last note.  Had elevated readings but in retrospect was very stressed at the time.  Since, home readings are much improved.  Mostly in the normal range although been elevated diastolic in the high 80s or 90s at times.  She feels well.  She has a busy life with work and 4 young children.  She denies chest pain or shortness of breath.  PCOS on Metformin.  She has been maintained on Metformin and her gynecologist is adjusting the dose.  She does need a fasting insulin level to see if the current dose is appropriate.  She feels well.  No weight gain or GI adverse effects.   Assessment  1. Prehypertension   2. PCOS (polycystic ovarian syndrome)      Plan   Prehypertension: Had long discussion regarding prehypertension versus hypertension.  At this point, can continue to monitor.  Recommend low-salt diet, exercise and weight loss.  Stress management reduction techniques discussed.  Will recheck in September to complete physical.  No antihypertensive medications indicated at this time.  Check renal function  PCOS on Metformin: We will recheck insulin levels.  Continue Metformin thousand daily.  Check fasting glucose.  Follow up: Return for as scheduled, complete physical.  01/08/2021  Orders Placed This Encounter  Procedures  . Basic metabolic panel  . Insulin, Free and Total   No orders of the defined types were placed in this encounter.     I reviewed the patients updated PMH, FH, and SocHx.     Patient Active Problem List   Diagnosis Date Noted  . Obesity (BMI 30-39.9) 01/08/2020  . 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 PCOS (polycystic ovarian syndrome)   . 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 Prehypertension 08/05/2011   Current Meds  Medication Sig  . aspirin EC 81 MG tablet Take 81 mg by mouth daily.  . diphenhydrAMINE (BENADRYL) 25 MG tablet Take 25 mg by mouth at bedtime.  Marland Kitchen loratadine (CLARITIN) 10 MG tablet Take 10 mg by mouth daily.  . metFORMIN (GLUMETZA) 1000 MG (MOD) 24 hr tablet Take 1 tablet (1,000 mg total) by mouth 2 (two) times daily. (Patient taking differently: Take 1,000 mg by mouth daily.)  . mometasone (ELOCON) 0.1 % cream Apply 1 application topically daily. (Patient taking differently: Apply 1 application topically as needed.)  . polyethylene glycol (MIRALAX / GLYCOLAX) packet Take 17 g by mouth daily as needed for mild constipation.    Allergies: Patient is allergic to naproxen and zoloft [sertraline hcl]. Family History: Patient family history includes Cancer in her maternal grandfather; Crohn's disease in her sister; Drug abuse in her father; Early death in her father; Hearing loss in her maternal grandfather; Hypertension in her mother; Irritable bowel syndrome in her mother; Mental illness in her maternal grandmother and paternal grandfather; Stroke in her brother. Social History:  Patient  reports that she has never smoked. She has never used smokeless tobacco. She reports that  she does not drink alcohol and does not use drugs.  Review of Systems: Constitutional: Negative for fever malaise or anorexia Cardiovascular: negative for chest pain Respiratory: negative for SOB or persistent cough Gastrointestinal: negative for abdominal pain  Objective  Vitals: BP (!) 144/92   Pulse (!) 105   Temp 98 F (36.7 C) (Temporal)   Ht 5\' 3"  (1.6 m)   Wt  202 lb 12.8 oz (92 kg)   SpO2 98%   BMI 35.92 kg/m  General: no acute distress , A&Ox3 HEENT: PEERL, conjunctiva normal, neck is supple Cardiovascular:  RRR without murmur or gallop.  No edema Respiratory:  Good breath sounds bilaterally, CTAB with normal respiratory effort      Commons side effects, risks, benefits, and alternatives for medications and treatment plan prescribed today were discussed, and the patient expressed understanding of the given instructions. Patient is instructed to call or message via MyChart if he/she has any questions or concerns regarding our treatment plan. No barriers to understanding were identified. We discussed Red Flag symptoms and signs in detail. Patient expressed understanding regarding what to do in case of urgent or emergency type symptoms.   Medication list was reconciled, printed and provided to the patient in AVS. Patient instructions and summary information was reviewed with the patient as documented in the AVS. This note was prepared with assistance of Dragon voice recognition software. Occasional wrong-word or sound-a-like substitutions may have occurred due to the inherent limitations of voice recognition software  This visit occurred during the SARS-CoV-2 public health emergency.  Safety protocols were in place, including screening questions prior to the visit, additional usage of staff PPE, and extensive cleaning of exam room while observing appropriate contact time as indicated for disinfecting solutions.

## 2020-08-05 LAB — INSULIN, FREE AND TOTAL
Free Insulin: 8 uU/mL
Total Insulin: 8 uU/mL

## 2020-08-23 DIAGNOSIS — Z20822 Contact with and (suspected) exposure to covid-19: Secondary | ICD-10-CM | POA: Diagnosis not present

## 2020-08-25 ENCOUNTER — Encounter: Payer: Self-pay | Admitting: Family Medicine

## 2020-08-26 DIAGNOSIS — R059 Cough, unspecified: Secondary | ICD-10-CM | POA: Diagnosis not present

## 2020-08-26 DIAGNOSIS — U071 COVID-19: Secondary | ICD-10-CM | POA: Diagnosis not present

## 2020-08-27 NOTE — Telephone Encounter (Signed)
Please call pt: unfortunately her note just got to [place].  Please ask when her symptoms started.  We can see her 11:30 virtually.

## 2020-10-13 ENCOUNTER — Other Ambulatory Visit: Payer: Self-pay

## 2020-10-13 ENCOUNTER — Ambulatory Visit (INDEPENDENT_AMBULATORY_CARE_PROVIDER_SITE_OTHER): Payer: BC Managed Care – PPO | Admitting: Family Medicine

## 2020-10-13 ENCOUNTER — Encounter (INDEPENDENT_AMBULATORY_CARE_PROVIDER_SITE_OTHER): Payer: Self-pay | Admitting: Family Medicine

## 2020-10-13 ENCOUNTER — Other Ambulatory Visit (INDEPENDENT_AMBULATORY_CARE_PROVIDER_SITE_OTHER): Payer: Self-pay | Admitting: Family Medicine

## 2020-10-13 VITALS — BP 127/77 | HR 89 | Temp 98.5°F | Ht 63.0 in | Wt 207.0 lb

## 2020-10-13 DIAGNOSIS — E282 Polycystic ovarian syndrome: Secondary | ICD-10-CM

## 2020-10-13 DIAGNOSIS — R0602 Shortness of breath: Secondary | ICD-10-CM | POA: Diagnosis not present

## 2020-10-13 DIAGNOSIS — Z9189 Other specified personal risk factors, not elsewhere classified: Secondary | ICD-10-CM | POA: Diagnosis not present

## 2020-10-13 DIAGNOSIS — R5383 Other fatigue: Secondary | ICD-10-CM

## 2020-10-13 DIAGNOSIS — K581 Irritable bowel syndrome with constipation: Secondary | ICD-10-CM

## 2020-10-13 DIAGNOSIS — Z1331 Encounter for screening for depression: Secondary | ICD-10-CM

## 2020-10-13 DIAGNOSIS — I10 Essential (primary) hypertension: Secondary | ICD-10-CM

## 2020-10-13 DIAGNOSIS — Z6836 Body mass index (BMI) 36.0-36.9, adult: Secondary | ICD-10-CM

## 2020-10-13 DIAGNOSIS — F419 Anxiety disorder, unspecified: Secondary | ICD-10-CM

## 2020-10-13 DIAGNOSIS — E559 Vitamin D deficiency, unspecified: Secondary | ICD-10-CM | POA: Diagnosis not present

## 2020-10-13 DIAGNOSIS — D6859 Other primary thrombophilia: Secondary | ICD-10-CM

## 2020-10-13 DIAGNOSIS — Z0289 Encounter for other administrative examinations: Secondary | ICD-10-CM

## 2020-10-13 DIAGNOSIS — E66812 Obesity, class 2: Secondary | ICD-10-CM

## 2020-10-13 DIAGNOSIS — R0683 Snoring: Secondary | ICD-10-CM

## 2020-10-13 MED ORDER — METFORMIN HCL ER (MOD) 1000 MG PO TB24
2000.0000 mg | ORAL_TABLET | Freq: Every day | ORAL | 3 refills | Status: DC
Start: 2020-10-13 — End: 2020-10-27

## 2020-10-13 NOTE — Telephone Encounter (Signed)
Dr.Wallace °

## 2020-10-14 NOTE — Telephone Encounter (Signed)
Working on PA

## 2020-10-15 LAB — CBC WITH DIFFERENTIAL/PLATELET
Basophils Absolute: 0 10*3/uL (ref 0.0–0.2)
Basos: 1 %
EOS (ABSOLUTE): 0.1 10*3/uL (ref 0.0–0.4)
Eos: 1 %
Hemoglobin: 12.7 g/dL (ref 11.1–15.9)
Immature Grans (Abs): 0 10*3/uL (ref 0.0–0.1)
Immature Granulocytes: 0 %
Lymphocytes Absolute: 2 10*3/uL (ref 0.7–3.1)
Lymphs: 24 %
MCH: 27.7 pg (ref 26.6–33.0)
MCHC: 32.6 g/dL (ref 31.5–35.7)
MCV: 85 fL (ref 79–97)
Monocytes Absolute: 0.4 10*3/uL (ref 0.1–0.9)
Monocytes: 5 %
Neutrophils Absolute: 5.9 10*3/uL (ref 1.4–7.0)
Neutrophils: 69 %
Platelets: 311 10*3/uL (ref 150–450)
RBC: 4.59 x10E6/uL (ref 3.77–5.28)
RDW: 13.5 % (ref 11.7–15.4)
WBC: 8.5 10*3/uL (ref 3.4–10.8)

## 2020-10-15 LAB — ANEMIA PANEL
Ferritin: 37 ng/mL (ref 15–150)
Folate, Hemolysate: 489 ng/mL
Folate, RBC: 1254 ng/mL (ref >498)
Hematocrit: 39 % (ref 34.0–46.6)
Iron Saturation: 13 % — ABNORMAL LOW (ref 15–55)
Iron: 46 ug/dL (ref 27–159)
Retic Ct Pct: 1.3 % (ref 0.6–2.6)
Total Iron Binding Capacity: 344 ug/dL (ref 250–450)
UIBC: 298 ug/dL (ref 131–425)
Vitamin B-12: 343 pg/mL (ref 232–1245)

## 2020-10-15 LAB — COMPREHENSIVE METABOLIC PANEL
ALT: 12 IU/L (ref 0–32)
AST: 12 IU/L (ref 0–40)
Albumin/Globulin Ratio: 1.3 (ref 1.2–2.2)
Albumin: 4.3 g/dL (ref 3.8–4.8)
Alkaline Phosphatase: 86 IU/L (ref 44–121)
BUN/Creatinine Ratio: 15 (ref 9–23)
BUN: 10 mg/dL (ref 6–24)
Bilirubin Total: 0.3 mg/dL (ref 0.0–1.2)
CO2: 22 mmol/L (ref 20–29)
Calcium: 9.4 mg/dL (ref 8.7–10.2)
Chloride: 101 mmol/L (ref 96–106)
Creatinine, Ser: 0.65 mg/dL (ref 0.57–1.00)
Globulin, Total: 3.3 g/dL (ref 1.5–4.5)
Glucose: 87 mg/dL (ref 65–99)
Potassium: 4.5 mmol/L (ref 3.5–5.2)
Sodium: 138 mmol/L (ref 134–144)
Total Protein: 7.6 g/dL (ref 6.0–8.5)
eGFR: 113 mL/min/{1.73_m2} (ref >59)

## 2020-10-15 LAB — TESTOSTERONE,FREE AND TOTAL
Testosterone, Free: 1.7 pg/mL (ref 0.0–4.2)
Testosterone: 36 ng/dL (ref 4–50)

## 2020-10-15 LAB — LIPID PANEL
Chol/HDL Ratio: 4.2 ratio (ref 0.0–4.4)
Cholesterol, Total: 183 mg/dL (ref 100–199)
HDL: 44 mg/dL (ref >39)
LDL Chol Calc (NIH): 116 mg/dL — ABNORMAL HIGH (ref 0–99)
Triglycerides: 131 mg/dL (ref 0–149)
VLDL Cholesterol Cal: 23 mg/dL (ref 5–40)

## 2020-10-15 LAB — VITAMIN D 25 HYDROXY (VIT D DEFICIENCY, FRACTURES): Vit D, 25-Hydroxy: 23.9 ng/mL — ABNORMAL LOW (ref 30.0–100.0)

## 2020-10-20 NOTE — Progress Notes (Signed)
Chief Complaint:   OBESITY Leah Moreno (MR# [id number]) is a 40 y.o. female who presents for evaluation and treatment of obesity and related comorbidities. Current BMI is Body mass index is 36.67 kg/m. Leah Moreno has been struggling with her weight for many years and has been unsuccessful in either losing weight, maintaining weight loss, or reaching her healthy weight goal.  Leah Moreno is currently in the action stage of change and ready to dedicate time achieving and maintaining a healthier weight. Leah Moreno is interested in becoming our patient and working on intensive lifestyle modifications including (but not limited to) diet and exercise for weight loss.  Leah Moreno is the mother of 4 children.  She is using [employer] for several meals per week.  Leah Moreno's habits were reviewed today and are as follows: Her family eats meals together, she thinks her family will eat healthier with her, her desired weight loss is 65 pounds, she has been heavy most of her life, she started gaining excessive weight at age 59, her heaviest weight ever was 214 pounds, she craves carbs and sweets, she snacks frequently in the evenings, she frequently makes poor food choices, she has problems with excessive hunger, and she struggles with emotional eating.  Depression Screen Leah Moreno's Food and Mood (modified PHQ-9) score was 5.  Depression screen [facility] 2/9 10/13/2020  Decreased Interest 1  Down, Depressed, Hopeless 0  PHQ - 2 Score 1  Altered sleeping 0  Tired, decreased energy 2  Change in appetite 1  Feeling bad or failure about yourself  1  Trouble concentrating 0  Moving slowly or fidgety/restless 0  Suicidal thoughts 0  PHQ-9 Score 5  Difficult doing work/chores Not difficult at all   Assessment/Plan:   Orders Placed This Encounter  Procedures   Anemia panel   CBC with Differential/Platelet   Comprehensive metabolic panel   Lipid panel   VITAMIN D 25 Hydroxy (Vit-D Deficiency, Fractures)   Testosterone,Free and Total   EKG  12-Lead   Meds ordered this encounter  Medications   metFORMIN (GLUMETZA) 1000 MG (MOD) 24 hr tablet    Sig: Take 2 tablets (2,000 mg total) by mouth daily with breakfast.    Dispense:  180 tablet    Refill:  3    1. Other fatigue Leah Moreno admits to daytime somnolence and reports waking up still tired. Patent has a history of symptoms of daytime fatigue, morning fatigue, and snoring. Leah Moreno generally gets 7 hours of sleep per night, and states that she has generally restful sleep. Snoring is present. Apneic episodes are not present. Epworth Sleepiness Score is 4.  Leah Moreno does feel that her weight is causing her energy to be lower than it should be. Fatigue may be related to obesity, depression or many other causes. Labs will be ordered, and in the meanwhile, Leah Moreno will focus on self care including making healthy food choices, increasing physical activity and focusing on stress reduction.  - EKG 12-Lead - Anemia panel - CBC with Differential/Platelet - Comprehensive metabolic panel - Lipid panel - VITAMIN D 25 Hydroxy (Vit-D Deficiency, Fractures) - Testosterone,Free and Total  2. SOB (shortness of breath) on exertion Leah Moreno notes increasing shortness of breath with exercising and seems to be worsening over time with weight gain. She notes getting out of breath sooner with activity than she used to. This has not gotten worse recently. Kieara denies shortness of breath at rest or orthopnea.  Leah Moreno does not feel that she gets out of breath more easily  that she used to when she exercises. Leah Moreno's shortness of breath appears to be obesity related and exercise induced. She has agreed to work on weight loss and gradually increase exercise to treat her exercise induced shortness of breath. Will continue to monitor closely.  - Anemia panel - CBC with Differential/Platelet - Comprehensive metabolic panel - Lipid panel - VITAMIN D 25 Hydroxy (Vit-D Deficiency, Fractures) - Testosterone,Free and Total  3. PCOS  (polycystic ovarian syndrome) Controlled when she is taking metformin. She will continue to focus on protein-rich, low simple carbohydrate foods. We reviewed the importance of hydration, regular exercise for stress reduction, and restorative sleep. Medication: metformin XL 2,000 mg daily.  Counseling PCOS is a leading cause of menstrual irregularities and infertility. It is also associated with obesity, hirsutism (excessive hair growth on the face, chest, or back), and cardiovascular risk factors such as high cholesterol and insulin resistance. Insulin resistance appears to play a central role.  Women with PCOS have been shown to have impaired appetite-regulating hormones. Metformin is one medication that can improve metabolic parameters.  Women with polycystic ovary syndrome (PCOS) have an increased risk for cardiovascular disease (CVD) - European Journal of Preventive Cardiology.  4. Hypercoagulable state (HCC) Will check labs today.  5. Essential hypertension At goal. Medications: None.   Plan: Avoid buying foods that are: processed, frozen, or prepackaged to avoid excess salt. We will watch for signs of hypotension as she continues lifestyle modifications.  Check labs today.  BP Readings from Last 3 Encounters:  10/13/20 127/77  07/28/20 (!) 144/92  06/23/20 138/80   Lab Results  Component Value Date   CREATININE 0.65 10/13/2020   6. Irritable bowel syndrome with constipation The cause is not completely understood, but is likely to be a combination of genetics, diet, stress, visceral hypersensitivity and the gut microbiome.  The available treatments aim to control symptoms even if unable to "cure" the condition.  While not physically harmful, IBS can have a significant impact on quality of life.  7. Snoring She endorses snoring.  Epworth Sleepiness Score is 4  8. Anxiety, with emotional eating Not at goal. Medication: None.  Plan: Behavior modification techniques were  discussed today to help deal with emotional/non-hunger eating behaviors.  Leah Moreno eats when stressed, as a reward, and for comfort.  9. Depression screening Leah Moreno was screened for depression as part of her new patient workup.  PHQ-9 is 5.  Leah Moreno had a positive depression screening. Depression is commonly associated with obesity and often results in emotional eating behaviors. We will monitor this closely and work on CBT to help improve the non-hunger eating patterns. Referral to Psychology may be required if no improvement is seen as she continues in our clinic.  10. At risk for heart disease Due to Leah Moreno's current state of health and medical condition(s), she is at a higher risk for heart disease.  This puts the patient at much greater risk to subsequently develop cardiopulmonary conditions that can significantly affect patient's quality of life in a negative manner.    At least 8 minutes were spent on counseling Leah Moreno about these concerns today. Evidence-based interventions for health behavior change were utilized today including the discussion of self monitoring techniques, problem-solving barriers, and SMART goal setting techniques.  Specifically, regarding patient's less desirable eating habits and patterns, we employed the technique of small changes when Leah Moreno has not been able to fully commit to her prudent nutritional plan.  11. Class 2 severe obesity with serious comorbidity and body  mass index (BMI) of 36.0 to 36.9 in adult, unspecified obesity type (HCC)  Leah Moreno is currently in the action stage of change and her goal is to continue with weight loss efforts. I recommend Leah Moreno begin the structured treatment plan as follows:  She has agreed to following a lower carbohydrate, vegetable and lean protein rich diet plan.  Gluten-free.  Exercise goals:  As is.    Behavioral modification strategies: increasing lean protein intake, decreasing simple carbohydrates, increasing vegetables, and increasing water  intake.  She was informed of the importance of frequent follow-up visits to maximize her success with intensive lifestyle modifications for her multiple health conditions. She was informed we would discuss her lab results at her next visit unless there is a critical issue that needs to be addressed sooner. Leah Moreno agreed to keep her next visit at the agreed upon time to discuss these results.  Objective:   Blood pressure 127/77, pulse 89, temperature 98.5 F (36.9 C), temperature source Oral, height 5\' 3"  (1.6 m), weight 207 lb (93.9 kg), last menstrual period 09/17/2020, SpO2 100 %. Body mass index is 36.67 kg/m.  EKG: Normal sinus rhythm, rate 84 bpm.  Indirect Calorimeter completed today shows a VO2 of 320 and a REE of 2228.  Her calculated basal metabolic rate is [id number] thus her basal metabolic rate is better than expected.  General: Cooperative, alert, well developed, in no acute distress. HEENT: Conjunctivae and lids unremarkable. Cardiovascular: Regular rhythm.  Lungs: Normal work of breathing. Neurologic: No focal deficits.   Lab Results  Component Value Date   CREATININE 0.65 10/13/2020   BUN 10 10/13/2020   NA 138 10/13/2020   K 4.5 10/13/2020   CL 101 10/13/2020   CO2 22 10/13/2020   Lab Results  Component Value Date   ALT 12 10/13/2020   AST 12 10/13/2020   ALKPHOS 86 10/13/2020   BILITOT 0.3 10/13/2020   Lab Results  Component Value Date   HGBA1C 5.5 01/08/2020   HGBA1C 5.5 09/28/2018   HGBA1C 5.5 08/29/2017   Lab Results  Component Value Date   TSH 1.51 01/08/2020   Lab Results  Component Value Date   CHOL 183 10/13/2020   HDL 44 10/13/2020   LDLCALC 116 (H) 10/13/2020   TRIG 131 10/13/2020   CHOLHDL 4.2 10/13/2020   Lab Results  Component Value Date   VD25OH 23.9 (L) 10/13/2020   Lab Results  Component Value Date   WBC 8.5 10/13/2020   HGB 12.7 10/13/2020   HCT 39.0 10/13/2020   MCV 85 10/13/2020   PLT 311 10/13/2020   Lab Results  Component  Value Date   IRON 46 10/13/2020   TIBC 344 10/13/2020   FERRITIN 37 10/13/2020   Attestation Statements:   This is the patient's first visit at [facility]. The patient's NEW PATIENT PACKET was reviewed at length. Included in the packet: current and past health history, medications, allergies, ROS, gynecologic history (women only), surgical history, family history, social history, weight history, weight loss surgery history (for those that have had weight loss surgery), nutritional evaluation, mood and food questionnaire, PHQ9, Epworth questionnaire, sleep habits questionnaire, patient life and health improvement goals questionnaire. These will all be scanned into the patient's chart under media.   During the visit, I independently reviewed the patient's EKG, bioimpedance scale results, and indirect calorimeter results. I used this information to tailor a meal plan for the patient that will help her to lose weight and will improve her  obesity-related conditions going forward. I performed a medically necessary appropriate examination and/or evaluation. I discussed the assessment and treatment plan with the patient. The patient was provided an opportunity to ask questions and all were answered. The patient agreed with the plan and demonstrated an understanding of the instructions. Labs were ordered at this visit and will be reviewed at the next visit unless more critical results need to be addressed immediately. Clinical information was updated and documented in the EMR.   I, Insurance claims handler, CMA, am acting as transcriptionist for Helane Rima, DO  I have reviewed the above documentation for accuracy and completeness, and I agree with the above. Helane Rima, DO

## 2020-10-26 NOTE — Telephone Encounter (Signed)
PA was denied due to pt not having the DX of type 2 DM, please advise

## 2020-10-27 ENCOUNTER — Encounter (INDEPENDENT_AMBULATORY_CARE_PROVIDER_SITE_OTHER): Payer: Self-pay | Admitting: Family Medicine

## 2020-10-27 ENCOUNTER — Ambulatory Visit (INDEPENDENT_AMBULATORY_CARE_PROVIDER_SITE_OTHER): Payer: BC Managed Care – PPO | Admitting: Family Medicine

## 2020-10-27 ENCOUNTER — Other Ambulatory Visit: Payer: Self-pay

## 2020-10-27 VITALS — BP 122/77 | HR 87 | Temp 98.1°F | Ht 63.0 in | Wt 208.0 lb

## 2020-10-27 DIAGNOSIS — E282 Polycystic ovarian syndrome: Secondary | ICD-10-CM | POA: Diagnosis not present

## 2020-10-27 DIAGNOSIS — Z9189 Other specified personal risk factors, not elsewhere classified: Secondary | ICD-10-CM | POA: Diagnosis not present

## 2020-10-27 DIAGNOSIS — M79672 Pain in left foot: Secondary | ICD-10-CM | POA: Diagnosis not present

## 2020-10-27 DIAGNOSIS — G8929 Other chronic pain: Secondary | ICD-10-CM

## 2020-10-27 DIAGNOSIS — E559 Vitamin D deficiency, unspecified: Secondary | ICD-10-CM

## 2020-10-27 DIAGNOSIS — Z6836 Body mass index (BMI) 36.0-36.9, adult: Secondary | ICD-10-CM

## 2020-10-28 MED ORDER — VITAMIN D (ERGOCALCIFEROL) 1.25 MG (50000 UNIT) PO CAPS: 50000.0000 [IU] | ORAL_CAPSULE | ORAL | 0 refills | Status: DC

## 2020-10-28 MED ORDER — METFORMIN HCL ER 500 MG PO TB24: 2000.0000 mg | ORAL_TABLET | Freq: Every day | ORAL | 3 refills | Status: AC

## 2020-11-03 ENCOUNTER — Ambulatory Visit: Payer: Self-pay

## 2020-11-03 ENCOUNTER — Ambulatory Visit (INDEPENDENT_AMBULATORY_CARE_PROVIDER_SITE_OTHER): Payer: BC Managed Care – PPO

## 2020-11-03 ENCOUNTER — Other Ambulatory Visit: Payer: Self-pay

## 2020-11-03 ENCOUNTER — Ambulatory Visit (INDEPENDENT_AMBULATORY_CARE_PROVIDER_SITE_OTHER): Payer: BC Managed Care – PPO | Admitting: Family Medicine

## 2020-11-03 VITALS — BP 178/98 | HR 94 | Ht 63.0 in | Wt 211.6 lb

## 2020-11-03 DIAGNOSIS — M79672 Pain in left foot: Secondary | ICD-10-CM

## 2020-11-03 DIAGNOSIS — M722 Plantar fascial fibromatosis: Secondary | ICD-10-CM | POA: Diagnosis not present

## 2020-11-03 NOTE — Patient Instructions (Addendum)
Thank you for coming in today.   Ice massage.   Night splint for plantar fasciitis.   Good footwear with heel cushion.  Fleet feet could help.   Please complete the exercises that the athletic trainer went over with you:  View at [email or web address] using code: [email or web address]  Recheck in about 6 weeks.   Arch Straps   Ok to try Voltaren gel.  Please use Voltaren gel (Generic Diclofenac Gel) up to 4x daily for pain as needed.  This is available over-the-counter as both the name brand Voltaren gel and the generic diclofenac gel.   Please get an Xray today before you leave

## 2020-11-03 NOTE — Progress Notes (Signed)
I, Leah Moreno, LAT, ATC acting as a scribe for Leah Graham, MD.  Subjective:    I'm seeing this patient as a consultation for: Dr. Helane Moreno. Note will be routed back to referring provider/PCP.  CC: Left foot pain  HPI: Pt is a 40 y/o female c/o L foot pain x 1.5 years. Pt recalls wearing a pair of wedges and then started to experience bilat foot pain. Pain in her R foot has resolved. Pt locates pain to the plantar aspect of the L heel and into the arch. Pain is worse 1st thing in the morning.  Aggravates: walking, standing Treatments tried: inserts, altering activity level.  Past medical history, Surgical history, Family history, Social history, Allergies, and medications have been entered into the medical record, reviewed.   Review of Systems: No new headache, visual changes, nausea, vomiting, diarrhea, constipation, dizziness, abdominal pain, skin rash, fevers, chills, night sweats, weight loss, swollen lymph nodes, body aches, joint swelling, muscle aches, chest pain, shortness of breath, mood changes, visual or auditory hallucinations.   Objective:    Vitals:   11/03/20 1031  BP: (!) 178/98  Pulse: 94  SpO2: 100%   General: Well Developed, well nourished, and in no acute distress.  Neuro/Psych: Alert and oriented x3, extra-ocular muscles intact, able to move all 4 extremities, sensation grossly intact. Skin: Warm and dry, no rashes noted.  Respiratory: Not using accessory muscles, speaking in full sentences, trachea midline.  Cardiovascular: Pulses palpable, no extremity edema. Abdomen: Does not appear distended. MSK: Left foot short and wide foot but otherwise normal appearing. Tender palpation plantar calcaneus.  Otherwise nontender.  Normal foot and ankle motion.  Lab and Radiology Results  Diagnostic Limited MSK Ultrasound of: Left plantar calcaneus Plantar fascial normal appearing until insertion onto calcaneus where it becomes thick measuring more than 4  mm No visible tear plantar fascial. Small calcaneal spur present. No bony defects present plantar calcaneus Impression: Plantar fasciitis  X-ray images left os calcis obtained today personally and independently interpreted Small plantar spur otherwise normal-appearing Await formal radiology review   Impression and Recommendations:    Assessment and Plan: 40 y.o. female with left plantar calcaneus pain ongoing for over a year.  Symptoms and exam are consistent with Planter fasciitis.  Plan to maximize conservative management with heel cushion high-quality footwear eccentric exercises and arch straps Voltaren gel and night splints.  Additionally recommend ice massage in the evening.  Recheck back in about 6 weeks.Marland Kitchen  PDMP not reviewed this encounter. Orders Placed This Encounter  Procedures   [place] LIMITED JOINT SPACE STRUCTURES LOW LEFT(NO LINKED CHARGES)    Standing Status:   Future    Number of Occurrences:   1    Standing Expiration Date:   05/06/2021    Order Specific Question:   Reason for Exam (SYMPTOM  OR DIAGNOSIS REQUIRED)    Answer:   left foot pain    Order Specific Question:   Preferred imaging location?    Answer:   [facility]    Standing Status:   Future    Number of Occurrences:   1    Standing Expiration Date:   11/03/2021    Order Specific Question:   Reason for Exam (SYMPTOM  OR DIAGNOSIS REQUIRED)    Answer:   eval plantar fasciitis    Order Specific Question:   Is patient pregnant?    Answer:   No    Order Specific  Question:   Preferred imaging location?    Answer:   [facility]   No orders of the defined types were placed in this encounter.   Discussed warning signs or symptoms. Please see discharge instructions. Patient expresses understanding.   The above documentation has been reviewed and is accurate and complete Lynne Leader, M.D.

## 2020-11-04 NOTE — Progress Notes (Signed)
Left heel bone looks normal to radiology.  Radiology did not comment on it but there is a tiny little heel spur present when I looked at the pictures myself.

## 2020-11-09 NOTE — Progress Notes (Signed)
Chief Complaint:   OBESITY Leah Moreno is here to discuss her progress with her obesity treatment plan along with follow-up of her obesity related diagnoses.   Today's visit was #: 2 Starting weight: 207 lbs Starting date: 10/13/2020 Today's weight: 208 lbs Today's date: 10/27/2020 Weight change since last visit: +1 lb Total lbs lost to date: +1 lb Body mass index is 36.85 kg/m.   Interim History:  Leah Moreno says she is happy about maintaining.  She has had several celebrations over the last few weeks, so overindulged at times. Current Meal Plan: following a lower carbohydrate, vegetable and lean protein rich diet plan for a small percentage of the time.  Current Exercise Plan: Increased activity.  Assessment/Plan:   Orders Placed This Encounter  Procedures   Ambulatory referral to Sports Medicine   Meds ordered this encounter  Medications   metFORMIN (GLUCOPHAGE-XR) 500 MG 24 hr tablet    Sig: Take 4 tablets (2,000 mg total) by mouth daily with breakfast.    Dispense:  360 tablet    Refill:  3   Vitamin D, Ergocalciferol, (DRISDOL) 1.25 MG (50000 UNIT) CAPS capsule    Sig: Take 1 capsule (50,000 Units total) by mouth every 7 (seven) days.    Dispense:  12 capsule    Refill:  0    1. Chronic pain in left foot Referral placed to Sports Medicine for chronic left foot pain.  Will continue to monitor.  - Ambulatory referral to Sports Medicine  2. PCOS (polycystic ovarian syndrome) Controlled. She will continue to focus on protein-rich, low simple carbohydrate foods. We reviewed the importance of hydration, regular exercise for stress reduction, and restorative sleep. Medication: metformin 2,000 mg daily.  Counseling PCOS is a leading cause of menstrual irregularities and infertility. It is also associated with obesity, hirsutism (excessive hair growth on the face, chest, or back), and cardiovascular risk factors such as high cholesterol and insulin resistance. Insulin resistance  appears to play a central role.  Women with PCOS have been shown to have impaired appetite-regulating hormones. Metformin is one medication that can improve metabolic parameters.  Women with polycystic ovary syndrome (PCOS) have an increased risk for cardiovascular disease (CVD) - European Journal of Preventive Cardiology.  - Refill metFORMIN (GLUCOPHAGE-XR) 500 MG 24 hr tablet; Take 4 tablets (2,000 mg total) by mouth daily with breakfast.  Dispense: 360 tablet; Refill: 3  3. Vitamin D deficiency Not at goal.  She is not currently on a vitamin D supplement, but does take a daily multivitamin.  Plan: Start to take prescription Vitamin D @50 ,000 IU every week as prescribed.  Follow-up for routine testing of Vitamin D, at least 2-3 times per year to avoid over-replacement.  Lab Results  Component Value Date   VD25OH 23.9 (L) 10/13/2020   - Start Vitamin D, Ergocalciferol, (DRISDOL) 1.25 MG (50000 UNIT) CAPS capsule; Take 1 capsule (50,000 Units total) by mouth every 7 (seven) days.  Dispense: 12 capsule; Refill: 0  4. At risk for diabetes mellitus Leah Moreno was given diabetes prevention education and counseling today of more than 8 minutes. During insulin resistance, several metabolic alterations induce the development of cardiovascular disease. For instance, insulin resistance can induce an imbalance in glucose metabolism that generates chronic hyperglycemia, which in turn triggers oxidative stress and causes an inflammatory response that leads to cell damage. Insulin resistance can also alter systemic lipid metabolism which then leads to the development of dyslipidemia and the well-known lipid triad: (1) high levels of  plasma triglycerides, (2) low levels of high-density lipoprotein, and (3) the appearance of small dense low-density lipoproteins. This triad, along with endothelial dysfunction, which can also be induced by aberrant insulin signaling, contribute to atherosclerotic plaque formation.   5.  Obesity, current BMI 36.8  Course: Leah Moreno is currently in the action stage of change. As such, her goal is to continue with weight loss efforts.   Nutrition goals: She has agreed to following a lower carbohydrate, vegetable and lean protein rich diet plan.   Exercise goals:  As is.  Behavioral modification strategies: increasing lean protein intake, decreasing simple carbohydrates, increasing vegetables, and increasing water intake.  Leah Moreno has agreed to follow-up with our clinic in 3 weeks. She was informed of the importance of frequent follow-up visits to maximize her success with intensive lifestyle modifications for her multiple health conditions.   Objective:   Blood pressure 122/77, pulse 87, temperature 98.1 F (36.7 C), temperature source Oral, height 5\' 3"  (1.6 m), weight 208 lb (94.3 kg), last menstrual period 10/20/2020, SpO2 99 %. Body mass index is 36.85 kg/m.  General: Cooperative, alert, well developed, in no acute distress. HEENT: Conjunctivae and lids unremarkable. Cardiovascular: Regular rhythm.  Lungs: Normal work of breathing. Neurologic: No focal deficits.   Lab Results  Component Value Date   CREATININE 0.65 10/13/2020   BUN 10 10/13/2020   NA 138 10/13/2020   K 4.5 10/13/2020   CL 101 10/13/2020   CO2 22 10/13/2020   Lab Results  Component Value Date   ALT 12 10/13/2020   AST 12 10/13/2020   ALKPHOS 86 10/13/2020   BILITOT 0.3 10/13/2020   Lab Results  Component Value Date   HGBA1C 5.5 01/08/2020   HGBA1C 5.5 09/28/2018   HGBA1C 5.5 08/29/2017   Lab Results  Component Value Date   TSH 1.51 01/08/2020   Lab Results  Component Value Date   CHOL 183 10/13/2020   HDL 44 10/13/2020   LDLCALC 116 (H) 10/13/2020   TRIG 131 10/13/2020   CHOLHDL 4.2 10/13/2020   Lab Results  Component Value Date   VD25OH 23.9 (L) 10/13/2020   Lab Results  Component Value Date   WBC 8.5 10/13/2020   HGB 12.7 10/13/2020   HCT 39.0 10/13/2020   MCV 85  10/13/2020   PLT 311 10/13/2020   Lab Results  Component Value Date   IRON 46 10/13/2020   TIBC 344 10/13/2020   FERRITIN 37 10/13/2020   Attestation Statements:   Reviewed by clinician on day of visit: allergies, medications, problem list, medical history, surgical history, family history, social history, and previous encounter notes.  I, 10/15/2020, CMA, am acting as transcriptionist for Insurance claims handler, DO  I have reviewed the above documentation for accuracy and completeness, and I agree with the above. Helane Rima, DO

## 2020-11-29 DIAGNOSIS — R21 Rash and other nonspecific skin eruption: Secondary | ICD-10-CM | POA: Diagnosis not present

## 2020-11-29 DIAGNOSIS — S70362S Insect bite (nonvenomous), left thigh, sequela: Secondary | ICD-10-CM | POA: Diagnosis not present

## 2020-12-14 NOTE — Progress Notes (Signed)
   I, Leah Moreno, LAT, ATC, am serving as scribe for Dr. Clementeen Graham.  Leah Moreno is a 40 y.o. female who presents to [facility] today for L plantar foot pain due to plantar fasciitis.  She was last seen by Dr. Denyse Amass on 11/03/20 was shown Alfredson's exercises.  She was advised to use a night splint at night and to look into shoes w/ proper heel cushion.  Today, pt reports that she is feeling much better between her new shoes and the night splints.  She states that she hasn't been doing her HEP consistently.  She will be going to [employer] today to get some inserts for his shoes so she can wear shoes other than running shoes.  Diagnostic imaging: L heel XR- 11/03/20  Pertinent review of systems: No fevers or chills  Relevant historical information: IBS.  PCOS.   Exam:  BP 120/80 (BP Location: Right Arm, Patient Position: Sitting, Cuff Size: Normal)   Pulse 85   Ht 5\' 3"  (1.6 m)   Wt 212 lb 12.8 oz (96.5 kg)   SpO2 99%   BMI 37.70 kg/m  General: Well Developed, well nourished, and in no acute distress.   MSK: Normal gait.    Lab and Radiology Results DG Os Calcis Left  Result Date: 11/03/2020 CLINICAL DATA:  40 year old female with left foot pain. Concern for plantar fasciitis. EXAM: LEFT OS CALCIS - 2+ VIEW COMPARISON:  None. FINDINGS: There is no evidence of fracture or other focal bone lesions. Soft tissues are unremarkable. IMPRESSION: Negative. Electronically Signed   By: 44 M.D.   On: 11/03/2020 21:27   11/05/2020 LIMITED JOINT SPACE STRUCTURES LOW LEFT(NO LINKED CHARGES)  Result Date: 11/06/2020 Diagnostic Limited MSK Ultrasound of: Left plantar calcaneus Plantar fascial normal appearing until insertion onto calcaneus where it becomes thick measuring more than 4 mm No visible tear plantar fascial. Small calcaneal spur present. No bony defects present plantar calcaneus Impression: Plantar fasciitis  I, 11/08/2020, personally  (independently) visualized and performed the interpretation of xray the images attached in this note.   Assessment and Plan: 40 y.o. female with bilateral foot pain due to Planter fasciitis.  Doing well with conservative management strategies.  Recommend as much adherence that she can to the home exercise program.  Clearly the night splints and the good supportive shoes are working for her.  States that this can be a long ongoing process to get better.  Discussed that injections can be helpful intermittently in the future if needed although would like to avoid this if possible.  Reviewed precautions and check back reasons.  Return as needed. Total encounter time 20 minutes including face-to-face time with the patient and, reviewing past medical record, and charting on the date of service.      Discussed warning signs or symptoms. Please see discharge instructions. Patient expresses understanding.   The above documentation has been reviewed and is accurate and complete 46, M.D.

## 2020-12-15 ENCOUNTER — Ambulatory Visit (INDEPENDENT_AMBULATORY_CARE_PROVIDER_SITE_OTHER): Payer: BC Managed Care – PPO | Admitting: Family Medicine

## 2020-12-15 ENCOUNTER — Encounter: Payer: Self-pay | Admitting: Family Medicine

## 2020-12-15 ENCOUNTER — Other Ambulatory Visit: Payer: Self-pay

## 2020-12-15 VITALS — BP 120/80 | HR 85 | Ht 63.0 in | Wt 212.8 lb

## 2020-12-15 DIAGNOSIS — M722 Plantar fascial fibromatosis: Secondary | ICD-10-CM

## 2020-12-15 NOTE — Patient Instructions (Signed)
Thank you for coming in today.   Continue home exercises.   OK to slowly wean into more normal shoes.   This often takes months even with ideal treatment.   Recheck as needed.   I can do injections at any time.   Try to do the exercises as much as you can.

## 2020-12-22 ENCOUNTER — Other Ambulatory Visit: Payer: Self-pay

## 2020-12-22 ENCOUNTER — Encounter (INDEPENDENT_AMBULATORY_CARE_PROVIDER_SITE_OTHER): Payer: Self-pay | Admitting: Family Medicine

## 2020-12-22 ENCOUNTER — Ambulatory Visit (INDEPENDENT_AMBULATORY_CARE_PROVIDER_SITE_OTHER): Payer: BC Managed Care – PPO | Admitting: Family Medicine

## 2020-12-22 ENCOUNTER — Other Ambulatory Visit (INDEPENDENT_AMBULATORY_CARE_PROVIDER_SITE_OTHER): Payer: Self-pay | Admitting: Family Medicine

## 2020-12-22 VITALS — BP 115/77 | HR 88 | Temp 98.1°F | Ht 63.0 in | Wt 207.0 lb

## 2020-12-22 DIAGNOSIS — Z9189 Other specified personal risk factors, not elsewhere classified: Secondary | ICD-10-CM

## 2020-12-22 DIAGNOSIS — E282 Polycystic ovarian syndrome: Secondary | ICD-10-CM

## 2020-12-22 DIAGNOSIS — Z6836 Body mass index (BMI) 36.0-36.9, adult: Secondary | ICD-10-CM

## 2020-12-22 DIAGNOSIS — F419 Anxiety disorder, unspecified: Secondary | ICD-10-CM

## 2020-12-22 DIAGNOSIS — E559 Vitamin D deficiency, unspecified: Secondary | ICD-10-CM

## 2020-12-22 DIAGNOSIS — E8881 Metabolic syndrome: Secondary | ICD-10-CM

## 2020-12-22 MED ORDER — VITAMIN D (ERGOCALCIFEROL) 1.25 MG (50000 UNIT) PO CAPS: 50000.0000 [IU] | ORAL_CAPSULE | ORAL | 0 refills | Status: DC

## 2020-12-22 MED ORDER — TIRZEPATIDE 2.5 MG/0.5ML ~~LOC~~ SOAJ: 2.5000 mg | SUBCUTANEOUS | 0 refills | Status: DC

## 2020-12-23 ENCOUNTER — Encounter (INDEPENDENT_AMBULATORY_CARE_PROVIDER_SITE_OTHER): Payer: Self-pay

## 2020-12-23 ENCOUNTER — Encounter (INDEPENDENT_AMBULATORY_CARE_PROVIDER_SITE_OTHER): Payer: Self-pay | Admitting: Family Medicine

## 2020-12-23 DIAGNOSIS — E8881 Metabolic syndrome: Secondary | ICD-10-CM

## 2020-12-23 MED ORDER — TIRZEPATIDE 2.5 MG/0.5ML ~~LOC~~ SOAJ: 2.5000 mg | SUBCUTANEOUS | 0 refills | Status: DC

## 2020-12-23 NOTE — Telephone Encounter (Signed)
Last OV with Dr Wallace 

## 2020-12-23 NOTE — Progress Notes (Signed)
Chief Complaint:   OBESITY Leah Moreno is here to discuss her progress with her obesity treatment plan along with follow-up of her obesity related diagnoses.   Today's visit was #: 3 Starting weight: 207 lbs Starting date: 10/13/2020 Today's weight: 207 lbs Today's date: 12/22/2020 Weight change since last visit: 1 lb Total lbs lost to date: 0 Body mass index is 36.67 kg/m.   Current Meal Plan: following a lower carbohydrate, vegetable and lean protein rich diet plan for 10-15% of the time.  Current Exercise Plan: Increased activity.   Interim History:  Leah Moreno reports that she has been traveling and has been busy.  She feels better, more energetic when taking metformin.  She could not tolerate increasing the dose to 2000 mg again, so she is taking 1500 mg.  We reviewed her weight trend over 2 years.  Assessment/Plan:   1. Insulin resistance, with polyphagia Goal is HgbA1c < 5.7, fasting insulin closer to 5.  Medication: metformin 1500 mg daily.    Plan:  Start Mounjaro 2.5 mg subcutaneously weekly.  She will continue to focus on protein-rich, low simple carbohydrate foods. We reviewed the importance of hydration, regular exercise for stress reduction, and restorative sleep.   Lab Results  Component Value Date   HGBA1C 5.5 01/08/2020   - Start tirzepatide [facility]) 2.5 MG/0.5ML Pen; Inject 2.5 mg into the skin once a week.  Dispense: 2 mL; Refill: 0  2. Vitamin D deficiency Not at goal.  She is taking vitamin D 50,000 IU weekly.  Plan: Continue to take prescription Vitamin D @50 ,000 IU every week as prescribed.  Follow-up for routine testing of Vitamin D, at least 2-3 times per year to avoid over-replacement.  Lab Results  Component Value Date   VD25OH 23.9 (L) 10/13/2020   - Refill Vitamin D, Ergocalciferol, (DRISDOL) 1.25 MG (50000 UNIT) CAPS capsule; Take 1 capsule (50,000 Units total) by mouth every 7 (seven) days.  Dispense: 12 capsule; Refill: 0  3. PCOS (polycystic  ovarian syndrome) She will continue to focus on protein-rich, low simple carbohydrate foods. We reviewed the importance of hydration, regular exercise for stress reduction, and restorative sleep. Medication: metformin 1500 mg daily.  Counseling PCOS is a leading cause of menstrual irregularities and infertility. It is also associated with obesity, hirsutism (excessive hair growth on the face, chest, or back), and cardiovascular risk factors such as high cholesterol and insulin resistance. Insulin resistance appears to play a central role.  Women with PCOS have been shown to have impaired appetite-regulating hormones. Metformin is one medication that can improve metabolic parameters.  Women with polycystic ovary syndrome (PCOS) have an increased risk for cardiovascular disease (CVD) - European Journal of Preventive Cardiology.  4. Anxiety, with emotional eating Not optimized. Medication: None.  Plan:  Discussed cues and consequences, how thoughts affect eating, model of thoughts, feelings, and behaviors, and strategies for change by focusing on the cue. Discussed cognitive distortions, coping thoughts, and how to change your thoughts.  5. At risk for nausea Leah Moreno is at risk for nausea due to starting [facility]. We reviewed ways to decrease side effect of nausea through eating small meals, avoiding high sugar or fat foods, and ultimately going back on the dose if not tolerating. Time > 9 minutes.  6. Obesity, current BMI 36.7  Course: Leah Moreno is currently in the action stage of change. As such, her goal is to continue with weight loss efforts.   Nutrition goals: She has agreed to following a lower  carbohydrate, vegetable and lean protein rich diet plan.   Exercise goals: For substantial health benefits, adults should do at least 150 minutes (2 hours and 30 minutes) a week of moderate-intensity, or 75 minutes (1 hour and 15 minutes) a week of vigorous-intensity aerobic physical activity, or an  equivalent combination of moderate- and vigorous-intensity aerobic activity. Aerobic activity should be performed in episodes of at least 10 minutes, and preferably, it should be spread throughout the week.  Behavioral modification strategies: increasing lean protein intake, decreasing simple carbohydrates, increasing vegetables, increasing water intake, and decreasing liquid calories.  Leah Moreno has agreed to follow-up with our clinic in 4 weeks. She was informed of the importance of frequent follow-up visits to maximize her success with intensive lifestyle modifications for her multiple health conditions.   Objective:   Blood pressure 115/77, pulse 88, temperature 98.1 F (36.7 C), temperature source Oral, height 5\' 3"  (1.6 m), weight 207 lb (93.9 kg), SpO2 98 %. Body mass index is 36.67 kg/m.  General: Cooperative, alert, well developed, in no acute distress. HEENT: Conjunctivae and lids unremarkable. Cardiovascular: Regular rhythm.  Lungs: Normal work of breathing. Neurologic: No focal deficits.   Lab Results  Component Value Date   CREATININE 0.65 10/13/2020   BUN 10 10/13/2020   NA 138 10/13/2020   K 4.5 10/13/2020   CL 101 10/13/2020   CO2 22 10/13/2020   Lab Results  Component Value Date   ALT 12 10/13/2020   AST 12 10/13/2020   ALKPHOS 86 10/13/2020   BILITOT 0.3 10/13/2020   Lab Results  Component Value Date   HGBA1C 5.5 01/08/2020   HGBA1C 5.5 09/28/2018   HGBA1C 5.5 08/29/2017   Lab Results  Component Value Date   TSH 1.51 01/08/2020   Lab Results  Component Value Date   CHOL 183 10/13/2020   HDL 44 10/13/2020   LDLCALC 116 (H) 10/13/2020   TRIG 131 10/13/2020   CHOLHDL 4.2 10/13/2020   Lab Results  Component Value Date   VD25OH 23.9 (L) 10/13/2020   Lab Results  Component Value Date   WBC 8.5 10/13/2020   HGB 12.7 10/13/2020   HCT 39.0 10/13/2020   MCV 85 10/13/2020   PLT 311 10/13/2020   Lab Results  Component Value Date   IRON 46 10/13/2020    TIBC 344 10/13/2020   FERRITIN 37 10/13/2020   Attestation Statements:   Reviewed by clinician on day of visit: allergies, medications, problem list, medical history, surgical history, family history, social history, and previous encounter notes.  I, 10/15/2020, CMA, am acting as transcriptionist for Insurance claims handler, DO  I have reviewed the above documentation for accuracy and completeness, and I agree with the above. Helane Rima, DO

## 2021-01-06 ENCOUNTER — Other Ambulatory Visit (INDEPENDENT_AMBULATORY_CARE_PROVIDER_SITE_OTHER): Payer: Self-pay | Admitting: Family Medicine

## 2021-01-06 DIAGNOSIS — E8881 Metabolic syndrome: Secondary | ICD-10-CM

## 2021-01-06 MED ORDER — TIRZEPATIDE 2.5 MG/0.5ML ~~LOC~~ SOAJ: 2.5000 mg | SUBCUTANEOUS | 0 refills | Status: DC

## 2021-01-08 ENCOUNTER — Encounter: Payer: BC Managed Care – PPO | Admitting: Family Medicine

## 2021-01-11 ENCOUNTER — Encounter (INDEPENDENT_AMBULATORY_CARE_PROVIDER_SITE_OTHER): Payer: Self-pay

## 2021-02-02 ENCOUNTER — Ambulatory Visit (INDEPENDENT_AMBULATORY_CARE_PROVIDER_SITE_OTHER): Payer: BC Managed Care – PPO | Admitting: Family Medicine

## 2021-02-02 ENCOUNTER — Encounter (INDEPENDENT_AMBULATORY_CARE_PROVIDER_SITE_OTHER): Payer: Self-pay | Admitting: Family Medicine

## 2021-02-02 ENCOUNTER — Other Ambulatory Visit: Payer: Self-pay

## 2021-02-02 VITALS — BP 116/74 | HR 84 | Temp 97.9°F | Ht 63.0 in | Wt 193.0 lb

## 2021-02-02 DIAGNOSIS — E8881 Metabolic syndrome: Secondary | ICD-10-CM

## 2021-02-02 DIAGNOSIS — R7301 Impaired fasting glucose: Secondary | ICD-10-CM | POA: Diagnosis not present

## 2021-02-02 DIAGNOSIS — Z6836 Body mass index (BMI) 36.0-36.9, adult: Secondary | ICD-10-CM | POA: Diagnosis not present

## 2021-02-02 MED ORDER — TIRZEPATIDE 5 MG/0.5ML ~~LOC~~ SOAJ: 5.0000 mg | SUBCUTANEOUS | 1 refills | Status: DC

## 2021-02-02 MED ORDER — ONDANSETRON 4 MG PO TBDP
4.0000 mg | ORAL_TABLET | Freq: Four times a day (QID) | ORAL | 0 refills | Status: DC | PRN
Start: 2021-02-02 — End: 2023-06-30

## 2021-02-04 NOTE — Progress Notes (Signed)
Chief Complaint:   OBESITY Leah Moreno is here to discuss her progress with her obesity treatment plan along with follow-up of her obesity related diagnoses. See Medical Weight Management Flowsheet for complete bioelectrical impedance results.  Today's visit was #: 4 Starting weight: 207 lbs Starting date: 10/13/2020 Weight change since last visit: 14 lbs Total lbs lost to date: 14 lbs Total weight loss percentage to date: -6.76%  Nutrition Plan: Lower carbohydrate, vegetable and lean protein rich diet plan for 5% of the time. Activity: Increased activity. Anti-obesity medications: Mounjaro 2.5 mg subcutaneously weekly. Reported side effects: None.  Interim History: Leah Moreno has been on Mounjaro 2.5 mg for 6 weeks.  She has been adjusting to it with regard to less nausea/diarrhea.  She is feeling hungrier at the end of the week.  She is open to increasing to 5 mg.  Still taking metformin 2000 mg.  Assessment/Plan:   1. Impaired fasting glucose Increase Mounjaro to 5 mg subcutaneously weekly, as per below. Refill Zofran 4 mg every 6 hours as needed for nausea or vomiting. I recommended discontinuing the Metformin due to lack of additional benefit. She will continue to focus on protein-rich, low simple carbohydrate foods. We reviewed the importance of hydration, regular exercise for stress reduction, and restorative sleep.   - Increase tirzepatide (MOUNJARO) 5 MG/0.5ML Pen; Inject 5 mg into the skin once a week.  Dispense: 2 mL; Refill: 1 - Refill ondansetron (ZOFRAN ODT) 4 MG disintegrating tablet; Take 1 tablet (4 mg total) by mouth every 6 (six) hours as needed for nausea or vomiting.  Dispense: 24 tablet; Refill: 0  2. Obesity, current BMI 34.2  Course: Leah Moreno is currently in the action stage of change. As such, her goal is to continue with weight loss efforts.   Nutrition goals: She has agreed to following a lower carbohydrate, vegetable and lean protein rich diet plan.   Exercise goals:   As is.  Behavioral modification strategies: increasing lean protein intake, decreasing simple carbohydrates, increasing vegetables, and increasing water intake.  Leah Moreno has agreed to follow-up with our clinic in 4 weeks. She was informed of the importance of frequent follow-up visits to maximize her success with intensive lifestyle modifications for her multiple health conditions.   Objective:   Blood pressure 116/74, pulse 84, temperature 97.9 F (36.6 C), temperature source Oral, height 5\' 3"  (1.6 m), weight 193 lb (87.5 kg), SpO2 98 %. Body mass index is 34.19 kg/m.  General: Cooperative, alert, well developed, in no acute distress. HEENT: Conjunctivae and lids unremarkable. Cardiovascular: Regular rhythm.  Lungs: Normal work of breathing. Neurologic: No focal deficits.   Lab Results  Component Value Date   CREATININE 0.65 10/13/2020   BUN 10 10/13/2020   NA 138 10/13/2020   K 4.5 10/13/2020   CL 101 10/13/2020   CO2 22 10/13/2020   Lab Results  Component Value Date   ALT 12 10/13/2020   AST 12 10/13/2020   ALKPHOS 86 10/13/2020   BILITOT 0.3 10/13/2020   Lab Results  Component Value Date   HGBA1C 5.5 01/08/2020   HGBA1C 5.5 09/28/2018   HGBA1C 5.5 08/29/2017   Lab Results  Component Value Date   TSH 1.51 01/08/2020   Lab Results  Component Value Date   CHOL 183 10/13/2020   HDL 44 10/13/2020   LDLCALC 116 (H) 10/13/2020   TRIG 131 10/13/2020   CHOLHDL 4.2 10/13/2020   Lab Results  Component Value Date   VD25OH 23.9 (L) 10/13/2020  Lab Results  Component Value Date   WBC 8.5 10/13/2020   HGB 12.7 10/13/2020   HCT 39.0 10/13/2020   MCV 85 10/13/2020   PLT 311 10/13/2020   Lab Results  Component Value Date   IRON 46 10/13/2020   TIBC 344 10/13/2020   FERRITIN 37 10/13/2020   Attestation Statements:   Reviewed by clinician on day of visit: allergies, medications, problem list, medical history, surgical history, family history, social history, and  previous encounter notes.  Time spent on visit including pre-visit chart review and post-visit care and charting was 32 minutes.   I, Insurance claims handler, CMA, am acting as transcriptionist for Helane Rima, DO  I have reviewed the above documentation for accuracy and completeness, and I agree with the above. -  Helane Rima, DO, MS, FAAFP, DABOM - Family and Bariatric Medicine.

## 2021-03-05 ENCOUNTER — Other Ambulatory Visit (INDEPENDENT_AMBULATORY_CARE_PROVIDER_SITE_OTHER): Payer: Self-pay | Admitting: Family Medicine

## 2021-03-05 DIAGNOSIS — R7301 Impaired fasting glucose: Secondary | ICD-10-CM

## 2021-03-08 NOTE — Telephone Encounter (Signed)
Dr.Wallace °

## 2021-03-16 ENCOUNTER — Other Ambulatory Visit: Payer: Self-pay

## 2021-03-16 ENCOUNTER — Encounter (INDEPENDENT_AMBULATORY_CARE_PROVIDER_SITE_OTHER): Payer: Self-pay | Admitting: Family Medicine

## 2021-03-16 ENCOUNTER — Ambulatory Visit (INDEPENDENT_AMBULATORY_CARE_PROVIDER_SITE_OTHER): Payer: BC Managed Care – PPO | Admitting: Family Medicine

## 2021-03-16 VITALS — BP 120/81 | HR 99 | Temp 98.1°F | Ht 63.0 in | Wt 180.0 lb

## 2021-03-16 DIAGNOSIS — E559 Vitamin D deficiency, unspecified: Secondary | ICD-10-CM | POA: Diagnosis not present

## 2021-03-16 DIAGNOSIS — R7301 Impaired fasting glucose: Secondary | ICD-10-CM

## 2021-03-16 DIAGNOSIS — E282 Polycystic ovarian syndrome: Secondary | ICD-10-CM | POA: Diagnosis not present

## 2021-03-16 DIAGNOSIS — Z6836 Body mass index (BMI) 36.0-36.9, adult: Secondary | ICD-10-CM

## 2021-03-16 MED ORDER — VITAMIN D (ERGOCALCIFEROL) 1.25 MG (50000 UNIT) PO CAPS: 50000.0000 [IU] | ORAL_CAPSULE | ORAL | 0 refills | Status: DC

## 2021-03-16 MED ORDER — TIRZEPATIDE 5 MG/0.5ML ~~LOC~~ SOAJ
5.0000 mg | SUBCUTANEOUS | 1 refills | Status: DC
  Filled 2021-04-30: qty 2, 28d supply, fill #0

## 2021-03-17 NOTE — Progress Notes (Signed)
Chief Complaint:   OBESITY Leah Moreno is here to discuss her progress with her obesity treatment plan along with follow-up of her obesity related diagnoses. See Medical Weight Management Flowsheet for complete bioelectrical impedance results.  Today's visit was #: 5 Starting weight: 207 lbs Starting date: 10/13/2020 Weight change since last visit: 13 lbs Total lbs lost to date: 27 lbs Total weight loss percentage to date: -13.04%  Nutrition Plan: Lower carbohydrate, vegetable and lean protein rich diet plan for 10% of the time. Activity: Walking for 30 minutes 5 times per week.  Anti-obesity medications: Mounjaro 5 mg subcutaneously weekly. Reported side effects: None.  Interim History: [name] says she is doing well. 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 Still taking Metformin, but has decreased the amount.   Assessment/Plan:   1. Vitamin D deficiency Not at goal. She is taking vitamin D 50,000 IU weekly.  Plan: Continue to take prescription Vitamin D @50 ,000 IU every week as prescribed.  Follow-up for routine testing of Vitamin D, at least 2-3 times per year to avoid over-replacement.  Lab Results  Component Value Date   VD25OH 23.9 (L) 10/13/2020   - Refill Vitamin D, Ergocalciferol, (DRISDOL) 1.25 MG (50000 UNIT) CAPS capsule; Take 1 capsule (50,000 Units total) by mouth every 7 (seven) days.  Dispense: 12 capsule; Refill: 0  2. Impaired fasting glucose Haylo is taking Mounjaro 5 mg subcutaneously weekly.  She will continue Mounjaro at this dose.  Refill sent to pharmacy today, as per below.  - Refill tirzepatide (MOUNJARO) 5 MG/0.5ML Pen; Inject 5 mg into the skin once a week.  Dispense: 3 mL; Refill: 1  3. PCOS (polycystic ovarian syndrome) She will continue to focus on protein-rich, low simple carbohydrate foods. We reviewed the importance of hydration, regular exercise for stress reduction, and restorative sleep.   Counseling PCOS is a leading cause of menstrual irregularities and  infertility. It is also associated with obesity, hirsutism (excessive hair growth on the face, chest, or back), and cardiovascular risk factors such as high cholesterol and insulin resistance. Insulin resistance appears to play a central role.  Women with PCOS have been shown to have impaired appetite-regulating hormones. Women with polycystic ovary syndrome (PCOS) have an increased risk for cardiovascular disease (CVD) - European Journal of Preventive Cardiology.  4. Obesity, current BMI 31.9  Course: Leah Moreno is currently in the action stage of change. As such, her goal is to continue with weight loss efforts.   Nutrition goals: She has agreed to following a lower carbohydrate, vegetable and lean protein rich diet plan.   Exercise goals:  As is.  Behavioral modification strategies: increasing lean protein intake, decreasing simple carbohydrates, increasing vegetables, and increasing water intake.  Leah Moreno has agreed to follow-up with our clinic in 4 weeks. She was informed of the importance of frequent follow-up visits to maximize her success with intensive lifestyle modifications for her multiple health conditions.   Objective:   Blood pressure 120/81, pulse 99, temperature 98.1 F (36.7 C), height 5\' 3"  (1.6 m), weight 180 lb (81.6 kg), SpO2 100 %. Body mass index is 31.89 kg/m.  General: Cooperative, alert, well developed, in no acute distress. HEENT: Conjunctivae and lids unremarkable. Cardiovascular: Regular rhythm.  Lungs: Normal work of breathing. Neurologic: No focal deficits.   Lab Results  Component Value Date   CREATININE 0.65 10/13/2020   BUN 10 10/13/2020   NA 138 10/13/2020   K 4.5 10/13/2020   CL 101 10/13/2020   CO2 22 10/13/2020   Lab  Results  Component Value Date   ALT 12 10/13/2020   AST 12 10/13/2020   ALKPHOS 86 10/13/2020   BILITOT 0.3 10/13/2020   Lab Results  Component Value Date   HGBA1C 5.5 01/08/2020   HGBA1C 5.5 09/28/2018   HGBA1C 5.5 08/29/2017    Lab Results  Component Value Date   TSH 1.51 01/08/2020   Lab Results  Component Value Date   CHOL 183 10/13/2020   HDL 44 10/13/2020   LDLCALC 116 (H) 10/13/2020   TRIG 131 10/13/2020   CHOLHDL 4.2 10/13/2020   Lab Results  Component Value Date   VD25OH 23.9 (L) 10/13/2020   Lab Results  Component Value Date   WBC 8.5 10/13/2020   HGB 12.7 10/13/2020   HCT 39.0 10/13/2020   MCV 85 10/13/2020   PLT 311 10/13/2020   Lab Results  Component Value Date   IRON 46 10/13/2020   TIBC 344 10/13/2020   FERRITIN 37 10/13/2020   Attestation Statements:   Reviewed by clinician on day of visit: allergies, medications, problem list, medical history, surgical history, family history, social history, and previous encounter notes.  I, Insurance claims handler, CMA, am acting as transcriptionist for Helane Rima, DO  I have reviewed the above documentation for accuracy and completeness, and I agree with the above. -  Helane Rima, DO, MS, FAAFP, DABOM - Family and Bariatric Medicine.

## 2021-03-30 ENCOUNTER — Encounter (INDEPENDENT_AMBULATORY_CARE_PROVIDER_SITE_OTHER): Payer: Self-pay | Admitting: Family Medicine

## 2021-04-22 ENCOUNTER — Other Ambulatory Visit (INDEPENDENT_AMBULATORY_CARE_PROVIDER_SITE_OTHER): Payer: Self-pay | Admitting: Family Medicine

## 2021-04-22 DIAGNOSIS — R7301 Impaired fasting glucose: Secondary | ICD-10-CM

## 2021-04-22 NOTE — Telephone Encounter (Signed)
LOV w/ Dr. Wallace

## 2021-04-26 ENCOUNTER — Other Ambulatory Visit (HOSPITAL_COMMUNITY): Payer: Self-pay

## 2021-04-30 ENCOUNTER — Other Ambulatory Visit (HOSPITAL_COMMUNITY): Payer: Self-pay

## 2021-05-04 ENCOUNTER — Ambulatory Visit (INDEPENDENT_AMBULATORY_CARE_PROVIDER_SITE_OTHER): Payer: BC Managed Care – PPO | Admitting: Family Medicine

## 2021-05-04 ENCOUNTER — Encounter (INDEPENDENT_AMBULATORY_CARE_PROVIDER_SITE_OTHER): Payer: Self-pay | Admitting: Family Medicine

## 2021-05-04 ENCOUNTER — Other Ambulatory Visit (HOSPITAL_COMMUNITY): Payer: Self-pay

## 2021-05-04 ENCOUNTER — Other Ambulatory Visit: Payer: Self-pay

## 2021-05-04 VITALS — BP 120/74 | HR 89 | Temp 98.1°F | Ht 63.0 in | Wt 170.0 lb

## 2021-05-04 DIAGNOSIS — E282 Polycystic ovarian syndrome: Secondary | ICD-10-CM | POA: Diagnosis not present

## 2021-05-04 DIAGNOSIS — E538 Deficiency of other specified B group vitamins: Secondary | ICD-10-CM

## 2021-05-04 DIAGNOSIS — E559 Vitamin D deficiency, unspecified: Secondary | ICD-10-CM

## 2021-05-04 DIAGNOSIS — Z6830 Body mass index (BMI) 30.0-30.9, adult: Secondary | ICD-10-CM

## 2021-05-04 DIAGNOSIS — R7301 Impaired fasting glucose: Secondary | ICD-10-CM

## 2021-05-04 DIAGNOSIS — Z6836 Body mass index (BMI) 36.0-36.9, adult: Secondary | ICD-10-CM

## 2021-05-04 DIAGNOSIS — E669 Obesity, unspecified: Secondary | ICD-10-CM

## 2021-05-04 MED ORDER — VITAMIN D (ERGOCALCIFEROL) 1.25 MG (50000 UNIT) PO CAPS
50000.0000 [IU] | ORAL_CAPSULE | ORAL | 0 refills | Status: DC
  Filled 2021-05-04 – 2021-06-14 (×2): qty 12, 84d supply, fill #0

## 2021-05-04 MED ORDER — TIRZEPATIDE 5 MG/0.5ML ~~LOC~~ SOAJ
5.0000 mg | SUBCUTANEOUS | 1 refills | Status: DC
  Filled 2021-05-21: qty 2, 28d supply, fill #0
  Filled 2021-06-14: qty 2, 28d supply, fill #1

## 2021-05-05 LAB — CBC WITH DIFFERENTIAL/PLATELET
Basophils Absolute: 0 10*3/uL (ref 0.0–0.2)
Basos: 1 %
EOS (ABSOLUTE): 0.1 10*3/uL (ref 0.0–0.4)
Eos: 1 %
Hemoglobin: 13 g/dL (ref 11.1–15.9)
Immature Grans (Abs): 0 10*3/uL (ref 0.0–0.1)
Immature Granulocytes: 0 %
Lymphocytes Absolute: 2.3 10*3/uL (ref 0.7–3.1)
Lymphs: 31 %
MCH: 27.5 pg (ref 26.6–33.0)
MCHC: 32.1 g/dL (ref 31.5–35.7)
MCV: 86 fL (ref 79–97)
Monocytes Absolute: 0.5 10*3/uL (ref 0.1–0.9)
Monocytes: 7 %
Neutrophils Absolute: 4.5 10*3/uL (ref 1.4–7.0)
Neutrophils: 60 %
Platelets: 303 10*3/uL (ref 150–450)
RBC: 4.73 x10E6/uL (ref 3.77–5.28)
RDW: 13.5 % (ref 11.7–15.4)
WBC: 7.4 10*3/uL (ref 3.4–10.8)

## 2021-05-05 LAB — ANEMIA PANEL
Ferritin: 71 ng/mL (ref 15–150)
Folate, Hemolysate: 448 ng/mL
Folate, RBC: 1106 ng/mL (ref >498)
Hematocrit: 40.5 % (ref 34.0–46.6)
Iron Saturation: 28 % (ref 15–55)
Iron: 75 ug/dL (ref 27–159)
Retic Ct Pct: 0.8 % (ref 0.6–2.6)
Total Iron Binding Capacity: 269 ug/dL (ref 250–450)
UIBC: 194 ug/dL (ref 131–425)
Vitamin B-12: 689 pg/mL (ref 232–1245)

## 2021-05-05 LAB — COMPREHENSIVE METABOLIC PANEL
ALT: 10 IU/L (ref 0–32)
AST: 14 IU/L (ref 0–40)
Albumin/Globulin Ratio: 1.5 (ref 1.2–2.2)
Albumin: 4.7 g/dL (ref 3.8–4.8)
Alkaline Phosphatase: 60 IU/L (ref 44–121)
BUN/Creatinine Ratio: 22 (ref 9–23)
BUN: 15 mg/dL (ref 6–24)
Bilirubin Total: 0.4 mg/dL (ref 0.0–1.2)
CO2: 17 mmol/L — ABNORMAL LOW (ref 20–29)
Calcium: 9.5 mg/dL (ref 8.7–10.2)
Chloride: 103 mmol/L (ref 96–106)
Creatinine, Ser: 0.68 mg/dL (ref 0.57–1.00)
Globulin, Total: 3.1 g/dL (ref 1.5–4.5)
Glucose: 80 mg/dL (ref 70–99)
Potassium: 4.8 mmol/L (ref 3.5–5.2)
Sodium: 142 mmol/L (ref 134–144)
Total Protein: 7.8 g/dL (ref 6.0–8.5)
eGFR: 111 mL/min/{1.73_m2} (ref >59)

## 2021-05-05 LAB — VITAMIN D 25 HYDROXY (VIT D DEFICIENCY, FRACTURES): Vit D, 25-Hydroxy: 61.8 ng/mL (ref 30.0–100.0)

## 2021-05-05 NOTE — Progress Notes (Signed)
Chief Complaint:   OBESITY Leah Moreno is here to discuss her progress with her obesity treatment plan along with follow-up of her obesity related diagnoses. See Medical Weight Management Flowsheet for complete bioelectrical impedance results.  Today's visit was #: 6 Starting weight: 207 lbs Starting date: 10/13/2020 Weight change since last visit: 10 lbs Total lbs lost to date: 37 lbs Total weight loss percentage to date: -17.87%  Nutrition Plan: Lower carbohydrate, vegetable and lean protein rich diet plan for 10-20% of the time. 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  Anti-obesity medications: Mounjaro 5 mg subcutaneously weekly. Reported side effects: None.  Interim History: Leah Moreno is back to [facility] 5 mg every 7 days.  See MyChart message.  She says that decreasing Mounjaro frequency to every 10 days and decreasing metformin led to increased hunger/cravings.  She says she has found a balance.  Assessment/Plan:   1. Impaired fasting glucose She is taking Mounjaro 5 mg subcutaneously once weekly.  Plan:  Continue Mounjaro 5 mg subcutaneously once weekly.  Will refill today.  Will also check CMP today.  - Refill tirzepatide (MOUNJARO) 5 MG/0.5ML Pen; Inject 5 mg into the skin once a week.  Dispense: 2 mL; Refill: 1 - Comprehensive metabolic panel  2. Vitamin D deficiency Not at goal. She is taking vitamin D 50,000 IU weekly.  Plan: Continue to take prescription Vitamin D @50 ,000 IU every week as prescribed.  Will check vitamin D level today..  Lab Results  Component Value Date   VD25OH 61.8 05/04/2021   VD25OH 23.9 (L) 10/13/2020   - Refill Vitamin D, Ergocalciferol, (DRISDOL) 1.25 MG (50000 UNIT) CAPS capsule; Take 1 capsule  by mouth every 7  days.  Dispense: 12 capsule; Refill: 0 - VITAMIN D 25 Hydroxy (Vit-D Deficiency, Fractures)  3. PCOS (polycystic ovarian syndrome) Medication: metformin 2000 mg daily.  Plan: She will continue to focus on protein-rich,  low simple carbohydrate foods. We reviewed the importance of hydration, regular exercise for stress reduction, and restorative sleep.   Counseling PCOS is a leading cause of menstrual irregularities and infertility. It is also associated with obesity, hirsutism (excessive hair growth on the face, chest, or back), and cardiovascular risk factors such as high cholesterol and insulin resistance. Insulin resistance appears to play a central role.  Women with PCOS have been shown to have impaired appetite-regulating hormones. Women with polycystic ovary syndrome (PCOS) have an increased risk for cardiovascular disease (CVD) - European Journal of Preventive Cardiology.  - Comprehensive metabolic panel  4. B12 deficiency Lab Results  Component Value Date   [id number] 05/04/2021   Supplementation: Multivitamin.   Plan:  Check anemia panel and CBC today.  - CBC with Differential/Platelet - Anemia panel  5. Obesity, current BMI 30.1  Course: Leah Moreno is currently in the action stage of change. As such, her goal is to continue with weight loss efforts.   Nutrition goals: She has agreed to following a lower carbohydrate, vegetable and lean protein rich diet plan.   Exercise goals:  As is.  Behavioral modification strategies: increasing lean protein intake, decreasing simple carbohydrates, increasing vegetables, and increasing water intake.  Leah Moreno has agreed to follow-up with our clinic in 4 weeks. She was informed of the importance of frequent follow-up visits to maximize her success with intensive lifestyle modifications for her multiple health conditions.   Leah Moreno was informed we would discuss her lab results at her next visit unless there is a critical issue that needs  to be addressed sooner. Leah Moreno agreed to keep her next visit at the agreed upon time to discuss these results.  Objective:   Blood pressure 120/74, pulse 89, temperature 98.1 F (36.7 C), height 5\' 3"  (1.6 m), weight 170 lb (77.1  kg), SpO2 99 %. Body mass index is 30.11 kg/m.  General: Cooperative, alert, well developed, in no acute distress. HEENT: Conjunctivae and lids unremarkable. Cardiovascular: Regular rhythm.  Lungs: Normal work of breathing. Neurologic: No focal deficits.   Lab Results  Component Value Date   CREATININE 0.68 05/04/2021   BUN 15 05/04/2021   NA 142 05/04/2021   K 4.8 05/04/2021   CL 103 05/04/2021   CO2 17 (L) 05/04/2021   Lab Results  Component Value Date   ALT 10 05/04/2021   AST 14 05/04/2021   ALKPHOS 60 05/04/2021   BILITOT 0.4 05/04/2021   Lab Results  Component Value Date   HGBA1C 5.5 01/08/2020   HGBA1C 5.5 09/28/2018   HGBA1C 5.5 08/29/2017   Lab Results  Component Value Date   TSH 1.51 01/08/2020   Lab Results  Component Value Date   CHOL 183 10/13/2020   HDL 44 10/13/2020   LDLCALC 116 (H) 10/13/2020   TRIG 131 10/13/2020   CHOLHDL 4.2 10/13/2020   Lab Results  Component Value Date   VD25OH 61.8 05/04/2021   VD25OH 23.9 (L) 10/13/2020   Lab Results  Component Value Date   WBC 7.4 05/04/2021   HGB 13.0 05/04/2021   HCT 40.5 05/04/2021   MCV 86 05/04/2021   PLT 303 05/04/2021   Lab Results  Component Value Date   IRON 75 05/04/2021   TIBC 269 05/04/2021   FERRITIN 71 05/04/2021   Attestation Statements:   Reviewed by clinician on day of visit: allergies, medications, problem list, medical history, surgical history, family history, social history, and previous encounter notes.  I, Water quality scientist, CMA, am acting as transcriptionist for Briscoe Deutscher, DO  I have reviewed the above documentation for accuracy and completeness, and I agree with the above. -  Briscoe Deutscher, DO, MS, FAAFP, DABOM - Family and Bariatric Medicine.

## 2021-05-10 DIAGNOSIS — H10022 Other mucopurulent conjunctivitis, left eye: Secondary | ICD-10-CM | POA: Diagnosis not present

## 2021-05-21 ENCOUNTER — Other Ambulatory Visit (HOSPITAL_COMMUNITY): Payer: Self-pay

## 2021-05-22 ENCOUNTER — Other Ambulatory Visit (HOSPITAL_COMMUNITY): Payer: Self-pay

## 2021-06-15 ENCOUNTER — Encounter (INDEPENDENT_AMBULATORY_CARE_PROVIDER_SITE_OTHER): Payer: Self-pay | Admitting: Family Medicine

## 2021-06-15 ENCOUNTER — Other Ambulatory Visit (HOSPITAL_COMMUNITY): Payer: Self-pay

## 2021-06-15 ENCOUNTER — Other Ambulatory Visit: Payer: Self-pay

## 2021-06-15 ENCOUNTER — Ambulatory Visit (INDEPENDENT_AMBULATORY_CARE_PROVIDER_SITE_OTHER): Payer: BC Managed Care – PPO | Admitting: Family Medicine

## 2021-06-15 VITALS — BP 105/69 | HR 79 | Temp 97.7°F | Ht 63.0 in | Wt 163.0 lb

## 2021-06-15 DIAGNOSIS — E282 Polycystic ovarian syndrome: Secondary | ICD-10-CM | POA: Diagnosis not present

## 2021-06-15 DIAGNOSIS — E669 Obesity, unspecified: Secondary | ICD-10-CM

## 2021-06-15 DIAGNOSIS — R7301 Impaired fasting glucose: Secondary | ICD-10-CM

## 2021-06-15 DIAGNOSIS — E8881 Metabolic syndrome: Secondary | ICD-10-CM | POA: Diagnosis not present

## 2021-06-15 DIAGNOSIS — Z6828 Body mass index (BMI) 28.0-28.9, adult: Secondary | ICD-10-CM

## 2021-06-15 DIAGNOSIS — E559 Vitamin D deficiency, unspecified: Secondary | ICD-10-CM | POA: Diagnosis not present

## 2021-06-15 DIAGNOSIS — Z6836 Body mass index (BMI) 36.0-36.9, adult: Secondary | ICD-10-CM

## 2021-06-17 ENCOUNTER — Other Ambulatory Visit (HOSPITAL_COMMUNITY): Payer: Self-pay

## 2021-06-17 MED ORDER — VITAMIN D (ERGOCALCIFEROL) 1.25 MG (50000 UNIT) PO CAPS
50000.0000 [IU] | ORAL_CAPSULE | ORAL | 0 refills | Status: AC
  Filled 2021-09-17: qty 12, 84d supply, fill #0

## 2021-06-17 MED ORDER — TIRZEPATIDE 5 MG/0.5ML ~~LOC~~ SOAJ
5.0000 mg | SUBCUTANEOUS | 1 refills | Status: DC
  Filled 2021-06-21: qty 2, 28d supply, fill #0

## 2021-06-21 ENCOUNTER — Other Ambulatory Visit (HOSPITAL_COMMUNITY): Payer: Self-pay

## 2021-06-22 NOTE — Progress Notes (Signed)
Chief Complaint:   OBESITY Leah Moreno is here to discuss her progress with her obesity treatment plan along with follow-up of her obesity related diagnoses. See Medical Weight Management Flowsheet for complete bioelectrical impedance results.  Today's visit was #: 7 Starting weight: 207 lbs Starting date: 10/13/2020 Weight change since last visit: 7 lbs Total lbs lost to date: 44 lbs Total weight loss percentage to date: -21.26%  Nutrition Plan: Lower carbohydrate, vegetable and lean protein rich diet plan for 20% of the time. Activity: Increased activity. Anti-obesity medications: Mounjaro 5 mg subcutaneously weekly. Reported side effects: None.  Interim History: Leah Moreno is happy with her continued weight loss.  She is worried about a stall.  Assessment/Plan:   1. Insulin resistance Goal is HgbA1c < 5.7, fasting insulin closer to 5.  Medication: metformin XR 1000 mg daily, Mounjaro 5 mg subcutaneously weekly.    Plan:  Continue medications.  Okay to increase metformin if stall or increased polyphagia.  Currently taking 1000 mg daily.  She will continue to focus on protein-rich, low simple carbohydrate foods. We reviewed the importance of hydration, regular exercise for stress reduction, and restorative sleep.   Lab Results  Component Value Date   HGBA1C 5.5 01/08/2020   2. Impaired fasting glucose Leah Moreno is taking Mounjaro 5 mg subcutaneously weekly.  Plan:  Continue Mounjaro 5 mg subcutaneously weekly.  - Refill tirzepatide (MOUNJARO) 5 MG/0.5ML Pen; Inject 5 mg into the skin once a week.  Dispense: 2 mL; Refill: 1  3. Vitamin D deficiency At goal. She is taking vitamin D 50,000 IU weekly.  Plan: Continue to take prescription Vitamin D @50 ,000 IU every week as prescribed.  Follow-up for routine testing of Vitamin D, at least 2-3 times per year to avoid over-replacement.  Lab Results  Component Value Date   VD25OH 61.8 05/04/2021   VD25OH 23.9 (L) 10/13/2020   - Refill Vitamin  D, Ergocalciferol, (DRISDOL) 1.25 MG (50000 UNIT) CAPS capsule; Take 1 capsule  by mouth every 7  days.  Dispense: 12 capsule; Refill: 0  4. PCOS (polycystic ovarian syndrome) She will continue to focus on protein-rich, low simple carbohydrate foods. We reviewed the importance of hydration, regular exercise for stress reduction, and restorative sleep.   Counseling PCOS is a leading cause of menstrual irregularities and infertility. It is also associated with obesity, hirsutism (excessive hair growth on the face, chest, or back), and cardiovascular risk factors such as high cholesterol and insulin resistance. Insulin resistance appears to play a central role.  Women with PCOS have been shown to have impaired appetite-regulating hormones. Women with polycystic ovary syndrome (PCOS) have an increased risk for cardiovascular disease (CVD) - European Journal of Preventive Cardiology.  5. Obesity, current BMI 28.9  Course: Leah Moreno is currently in the action stage of change. As such, her goal is to continue with weight loss efforts.   Nutrition goals: She has agreed to following a lower carbohydrate, vegetable and lean protein rich diet plan.   Exercise goals:  As is.  Behavioral modification strategies: increasing lean protein intake, decreasing simple carbohydrates, and increasing vegetables.  Leah Moreno has agreed to follow-up with our clinic in 4 weeks. She was informed of the importance of frequent follow-up visits to maximize her success with intensive lifestyle modifications for her multiple health conditions.   Objective:   Blood pressure 105/69, pulse 79, temperature 97.7 F (36.5 C), temperature source Oral, height 5\' 3"  (1.6 m), weight 163 lb (73.9 kg), SpO2 100 %. Body mass  index is 28.87 kg/m.  General: Cooperative, alert, well developed, in no acute distress. HEENT: Conjunctivae and lids unremarkable. Cardiovascular: Regular rhythm.  Lungs: Normal work of breathing. Neurologic: No focal  deficits.   Lab Results  Component Value Date   CREATININE 0.68 05/04/2021   BUN 15 05/04/2021   NA 142 05/04/2021   K 4.8 05/04/2021   CL 103 05/04/2021   CO2 17 (L) 05/04/2021   Lab Results  Component Value Date   ALT 10 05/04/2021   AST 14 05/04/2021   ALKPHOS 60 05/04/2021   BILITOT 0.4 05/04/2021   Lab Results  Component Value Date   HGBA1C 5.5 01/08/2020   HGBA1C 5.5 09/28/2018   HGBA1C 5.5 08/29/2017   Lab Results  Component Value Date   TSH 1.51 01/08/2020   Lab Results  Component Value Date   CHOL 183 10/13/2020   HDL 44 10/13/2020   LDLCALC 116 (H) 10/13/2020   TRIG 131 10/13/2020   CHOLHDL 4.2 10/13/2020   Lab Results  Component Value Date   VD25OH 61.8 05/04/2021   VD25OH 23.9 (L) 10/13/2020   Lab Results  Component Value Date   WBC 7.4 05/04/2021   HGB 13.0 05/04/2021   HCT 40.5 05/04/2021   MCV 86 05/04/2021   PLT 303 05/04/2021   Lab Results  Component Value Date   IRON 75 05/04/2021   TIBC 269 05/04/2021   FERRITIN 71 05/04/2021   Attestation Statements:   Reviewed by clinician on day of visit: allergies, medications, problem list, medical history, surgical history, family history, social history, and previous encounter notes.  I, Water quality scientist, CMA, am acting as transcriptionist for Briscoe Deutscher, DO  I have reviewed the above documentation for accuracy and completeness, and I agree with the above. -  Briscoe Deutscher, DO, MS, FAAFP, DABOM - Family and Bariatric Medicine.

## 2021-07-20 ENCOUNTER — Encounter (INDEPENDENT_AMBULATORY_CARE_PROVIDER_SITE_OTHER): Payer: Self-pay | Admitting: Family Medicine

## 2021-07-20 ENCOUNTER — Ambulatory Visit (INDEPENDENT_AMBULATORY_CARE_PROVIDER_SITE_OTHER): Payer: BC Managed Care – PPO | Admitting: Family Medicine

## 2021-07-20 ENCOUNTER — Other Ambulatory Visit (HOSPITAL_COMMUNITY): Payer: Self-pay

## 2021-07-20 VITALS — BP 108/72 | HR 94 | Temp 97.8°F | Ht 63.0 in | Wt 158.0 lb

## 2021-07-20 DIAGNOSIS — R7301 Impaired fasting glucose: Secondary | ICD-10-CM | POA: Diagnosis not present

## 2021-07-20 DIAGNOSIS — E282 Polycystic ovarian syndrome: Secondary | ICD-10-CM | POA: Diagnosis not present

## 2021-07-20 DIAGNOSIS — E538 Deficiency of other specified B group vitamins: Secondary | ICD-10-CM

## 2021-07-20 DIAGNOSIS — E669 Obesity, unspecified: Secondary | ICD-10-CM | POA: Diagnosis not present

## 2021-07-20 DIAGNOSIS — Z6828 Body mass index (BMI) 28.0-28.9, adult: Secondary | ICD-10-CM

## 2021-07-20 MED ORDER — TIRZEPATIDE 5 MG/0.5ML ~~LOC~~ SOAJ
5.0000 mg | SUBCUTANEOUS | 4 refills | Status: DC
Start: 2021-07-20 — End: 2023-06-30
  Filled 2021-07-20: qty 2, 28d supply, fill #0
  Filled 2021-08-18: qty 2, 28d supply, fill #1
  Filled 2021-09-17: qty 2, 28d supply, fill #2
  Filled 2021-10-06 – 2021-10-25 (×3): qty 2, 28d supply, fill #3

## 2021-07-26 NOTE — Progress Notes (Signed)
Chief Complaint:   OBESITY Leah Moreno is here to discuss her progress with her obesity treatment plan along with follow-up of her obesity related diagnoses. See Medical Weight Management Flowsheet for complete bioelectrical impedance results.  Today's visit was #: 8 Starting weight: 207 lbs Starting date: 10/13/2020 Weight change since last visit: 5 lbs Total lbs lost to date: 49 lbs Total weight loss percentage to date: -23.67%  Nutrition Plan: Lower carbohydrate, vegetable and lean protein rich diet plan for 30% of the time.  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 Anti-obesity medications: Mounjaro 5 mg subcutaneously weekly. Reported side effects: None.  Interim History: Leah Moreno says that life is great but busy.  She sometimes feels fatigued.  Assessment/Plan:   1. Impaired fasting glucose [name] is currently taking Mounjaro 5 mg subcutaneously weekly.  She will continue this dose.  She will continue to focus on protein-rich, low simple carbohydrate foods. We reviewed the importance of hydration, regular exercise for stress reduction, and restorative sleep.   - Refill tirzepatide (MOUNJARO) 5 MG/0.5ML Pen; Inject 5 mg into the skin once a week.  Dispense: 2 mL; Refill: 4  2. PCOS (polycystic ovarian syndrome) She will continue to focus on protein-rich, low simple carbohydrate foods. We reviewed the importance of hydration, regular exercise for stress reduction, and restorative sleep.   Counseling PCOS is a leading cause of menstrual irregularities and infertility. It is also associated with obesity, hirsutism (excessive hair growth on the face, chest, or back), and cardiovascular risk factors such as high cholesterol and insulin resistance. Insulin resistance appears to play a central role.  Women with PCOS have been shown to have impaired appetite-regulating hormones. Women with polycystic ovary syndrome (PCOS) have an increased risk for cardiovascular disease (CVD) - European Journal of  Preventive Cardiology.  3. B12 deficiency Leah Moreno is taking a daily multivitamin.  The current medical regimen is effective;  continue present plan and medications.  4. Obesity, current BMI 28  Course: Tishina is currently in the action stage of change. As such, her goal is to continue with weight loss efforts.   Nutrition goals: She has agreed to following a lower carbohydrate, vegetable and lean protein rich diet plan.   Exercise goals:  As is.  Behavioral modification strategies: increasing lean protein intake, decreasing simple carbohydrates, increasing vegetables, and increasing water intake.  [name] has agreed to follow-up with our clinic in 6 weeks. She was informed of the importance of frequent follow-up visits to maximize her success with intensive lifestyle modifications for her multiple health conditions.   Objective:   Blood pressure 108/72, pulse 94, temperature 97.8 F (36.6 C), temperature source Oral, height 5\' 3"  (1.6 m), weight 158 lb (71.7 kg), SpO2 100 %. Body mass index is 27.99 kg/m.  General: Cooperative, alert, well developed, in no acute distress. HEENT: Conjunctivae and lids unremarkable. Cardiovascular: Regular rhythm.  Lungs: Normal work of breathing. Neurologic: No focal deficits.   Lab Results  Component Value Date   CREATININE 0.68 05/04/2021   BUN 15 05/04/2021   NA 142 05/04/2021   K 4.8 05/04/2021   CL 103 05/04/2021   CO2 17 (L) 05/04/2021   Lab Results  Component Value Date   ALT 10 05/04/2021   AST 14 05/04/2021   ALKPHOS 60 05/04/2021   BILITOT 0.4 05/04/2021   Lab Results  Component Value Date   HGBA1C 5.5 01/08/2020   HGBA1C 5.5 09/28/2018   HGBA1C 5.5 08/29/2017   Lab Results  Component Value Date  TSH 1.51 01/08/2020   Lab Results  Component Value Date   CHOL 183 10/13/2020   HDL 44 10/13/2020   LDLCALC 116 (H) 10/13/2020   TRIG 131 10/13/2020   CHOLHDL 4.2 10/13/2020   Lab Results  Component Value Date   VD25OH 61.8  05/04/2021   VD25OH 23.9 (L) 10/13/2020   Lab Results  Component Value Date   WBC 7.4 05/04/2021   HGB 13.0 05/04/2021   HCT 40.5 05/04/2021   MCV 86 05/04/2021   PLT 303 05/04/2021   Lab Results  Component Value Date   IRON 75 05/04/2021   TIBC 269 05/04/2021   FERRITIN 71 05/04/2021   Attestation Statements:   Reviewed by clinician on day of visit: allergies, medications, problem list, medical history, surgical history, family history, social history, and previous encounter notes.  I, Insurance claims handler, CMA, am acting as transcriptionist for Helane Rima, DO  I have reviewed the above documentation for accuracy and completeness, and I agree with the above. -  Helane Rima, DO, MS, FAAFP, DABOM - Family and Bariatric Medicine.

## 2021-08-19 ENCOUNTER — Other Ambulatory Visit (HOSPITAL_COMMUNITY): Payer: Self-pay

## 2021-09-06 DIAGNOSIS — R7989 Other specified abnormal findings of blood chemistry: Secondary | ICD-10-CM | POA: Diagnosis not present

## 2021-09-06 DIAGNOSIS — E0789 Other specified disorders of thyroid: Secondary | ICD-10-CM | POA: Diagnosis not present

## 2021-09-06 DIAGNOSIS — Q909 Down syndrome, unspecified: Secondary | ICD-10-CM | POA: Diagnosis not present

## 2021-09-06 DIAGNOSIS — R6250 Unspecified lack of expected normal physiological development in childhood: Secondary | ICD-10-CM | POA: Diagnosis not present

## 2021-09-17 ENCOUNTER — Other Ambulatory Visit (HOSPITAL_COMMUNITY): Payer: Self-pay

## 2021-10-07 ENCOUNTER — Other Ambulatory Visit (HOSPITAL_COMMUNITY): Payer: Self-pay

## 2021-10-13 ENCOUNTER — Other Ambulatory Visit (HOSPITAL_COMMUNITY): Payer: Self-pay

## 2021-10-23 ENCOUNTER — Other Ambulatory Visit (HOSPITAL_COMMUNITY): Payer: Self-pay

## 2021-10-26 ENCOUNTER — Other Ambulatory Visit (HOSPITAL_COMMUNITY): Payer: Self-pay

## 2021-10-28 DIAGNOSIS — Z8639 Personal history of other endocrine, nutritional and metabolic disease: Secondary | ICD-10-CM | POA: Diagnosis not present

## 2021-10-28 DIAGNOSIS — E282 Polycystic ovarian syndrome: Secondary | ICD-10-CM | POA: Diagnosis not present

## 2021-10-28 DIAGNOSIS — E559 Vitamin D deficiency, unspecified: Secondary | ICD-10-CM | POA: Diagnosis not present

## 2021-10-28 DIAGNOSIS — E8881 Metabolic syndrome: Secondary | ICD-10-CM | POA: Diagnosis not present

## 2021-10-29 ENCOUNTER — Other Ambulatory Visit (HOSPITAL_COMMUNITY): Payer: Self-pay

## 2021-10-29 MED ORDER — ERGOCALCIFEROL 1.25 MG (50000 UT) PO CAPS
ORAL_CAPSULE | ORAL | 2 refills | Status: DC
  Filled 2021-10-29: qty 4, 30d supply, fill #0
  Filled 2021-11-08 – 2021-11-12 (×2): qty 4, 28d supply, fill #0
  Filled 2021-12-10: qty 4, 28d supply, fill #1
  Filled 2022-03-24: qty 4, 28d supply, fill #2

## 2021-10-29 MED ORDER — MOUNJARO 5 MG/0.5ML ~~LOC~~ SOAJ
SUBCUTANEOUS | 1 refills | Status: DC
  Filled 2021-10-29: qty 1, 28d supply, fill #0

## 2021-11-03 ENCOUNTER — Other Ambulatory Visit (HOSPITAL_COMMUNITY): Payer: Self-pay

## 2021-11-04 ENCOUNTER — Other Ambulatory Visit (HOSPITAL_COMMUNITY): Payer: Self-pay

## 2021-11-04 MED ORDER — WEGOVY 0.25 MG/0.5ML ~~LOC~~ SOAJ
SUBCUTANEOUS | 1 refills | Status: DC
  Filled 2021-11-04: qty 2, 28d supply, fill #0
  Filled 2021-12-03 – 2021-12-30 (×3): qty 2, 28d supply, fill #1

## 2021-11-05 ENCOUNTER — Other Ambulatory Visit (HOSPITAL_COMMUNITY): Payer: Self-pay

## 2021-11-09 ENCOUNTER — Other Ambulatory Visit (HOSPITAL_COMMUNITY): Payer: Self-pay

## 2021-11-10 ENCOUNTER — Other Ambulatory Visit (HOSPITAL_COMMUNITY): Payer: Self-pay

## 2021-11-11 ENCOUNTER — Other Ambulatory Visit (HOSPITAL_COMMUNITY): Payer: Self-pay

## 2021-11-12 ENCOUNTER — Other Ambulatory Visit (HOSPITAL_COMMUNITY): Payer: Self-pay

## 2021-11-16 ENCOUNTER — Other Ambulatory Visit (HOSPITAL_COMMUNITY): Payer: Self-pay

## 2021-11-19 ENCOUNTER — Other Ambulatory Visit (HOSPITAL_COMMUNITY): Payer: Self-pay

## 2021-11-24 ENCOUNTER — Encounter (INDEPENDENT_AMBULATORY_CARE_PROVIDER_SITE_OTHER): Payer: Self-pay

## 2021-11-30 DIAGNOSIS — Z8639 Personal history of other endocrine, nutritional and metabolic disease: Secondary | ICD-10-CM | POA: Diagnosis not present

## 2021-11-30 DIAGNOSIS — E282 Polycystic ovarian syndrome: Secondary | ICD-10-CM | POA: Diagnosis not present

## 2021-11-30 DIAGNOSIS — E8881 Metabolic syndrome: Secondary | ICD-10-CM | POA: Diagnosis not present

## 2021-11-30 DIAGNOSIS — K5903 Drug induced constipation: Secondary | ICD-10-CM | POA: Diagnosis not present

## 2021-12-03 ENCOUNTER — Other Ambulatory Visit (HOSPITAL_COMMUNITY): Payer: Self-pay

## 2021-12-14 ENCOUNTER — Other Ambulatory Visit (HOSPITAL_COMMUNITY): Payer: Self-pay

## 2021-12-30 ENCOUNTER — Other Ambulatory Visit (HOSPITAL_COMMUNITY): Payer: Self-pay

## 2021-12-31 ENCOUNTER — Other Ambulatory Visit (HOSPITAL_COMMUNITY): Payer: Self-pay

## 2022-01-05 DIAGNOSIS — Z6826 Body mass index (BMI) 26.0-26.9, adult: Secondary | ICD-10-CM | POA: Diagnosis not present

## 2022-01-05 DIAGNOSIS — J0100 Acute maxillary sinusitis, unspecified: Secondary | ICD-10-CM | POA: Diagnosis not present

## 2022-01-06 ENCOUNTER — Other Ambulatory Visit (HOSPITAL_COMMUNITY): Payer: Self-pay

## 2022-01-10 ENCOUNTER — Encounter: Payer: Self-pay | Admitting: *Deleted

## 2022-01-12 ENCOUNTER — Other Ambulatory Visit (HOSPITAL_COMMUNITY): Payer: Self-pay

## 2022-01-12 DIAGNOSIS — E663 Overweight: Secondary | ICD-10-CM | POA: Diagnosis not present

## 2022-01-12 DIAGNOSIS — R053 Chronic cough: Secondary | ICD-10-CM | POA: Diagnosis not present

## 2022-01-12 DIAGNOSIS — F439 Reaction to severe stress, unspecified: Secondary | ICD-10-CM | POA: Diagnosis not present

## 2022-01-12 DIAGNOSIS — E282 Polycystic ovarian syndrome: Secondary | ICD-10-CM | POA: Diagnosis not present

## 2022-01-12 MED ORDER — AZITHROMYCIN 250 MG PO TABS
ORAL_TABLET | ORAL | 0 refills | Status: DC
  Filled 2022-01-12: qty 6, 5d supply, fill #0

## 2022-01-13 ENCOUNTER — Other Ambulatory Visit (HOSPITAL_COMMUNITY): Payer: Self-pay

## 2022-01-13 MED ORDER — AZITHROMYCIN 250 MG PO TABS
ORAL_TABLET | ORAL | 0 refills | Status: DC
  Filled 2022-01-13: qty 6, 5d supply, fill #0

## 2022-02-18 ENCOUNTER — Other Ambulatory Visit (HOSPITAL_COMMUNITY): Payer: Self-pay

## 2022-02-18 MED ORDER — ALLI 60 MG PO CAPS
60.0000 mg | ORAL_CAPSULE | Freq: Three times a day (TID) | ORAL | 0 refills | Status: DC
  Filled 2022-02-18 – 2022-03-24 (×2): qty 90, 30d supply, fill #0

## 2022-02-22 ENCOUNTER — Other Ambulatory Visit (HOSPITAL_COMMUNITY): Payer: Self-pay

## 2022-02-23 ENCOUNTER — Other Ambulatory Visit (HOSPITAL_COMMUNITY): Payer: Self-pay

## 2022-02-23 DIAGNOSIS — E282 Polycystic ovarian syndrome: Secondary | ICD-10-CM | POA: Diagnosis not present

## 2022-02-23 DIAGNOSIS — K5903 Drug induced constipation: Secondary | ICD-10-CM | POA: Diagnosis not present

## 2022-02-23 DIAGNOSIS — F418 Other specified anxiety disorders: Secondary | ICD-10-CM | POA: Diagnosis not present

## 2022-02-23 MED ORDER — METFORMIN HCL ER 500 MG PO TB24
2000.0000 mg | ORAL_TABLET | Freq: Every day | ORAL | 0 refills | Status: DC
  Filled 2022-02-23: qty 120, 30d supply, fill #0

## 2022-02-24 ENCOUNTER — Other Ambulatory Visit (HOSPITAL_COMMUNITY): Payer: Self-pay

## 2022-03-24 ENCOUNTER — Other Ambulatory Visit (HOSPITAL_COMMUNITY): Payer: Self-pay

## 2022-03-24 MED ORDER — METFORMIN HCL ER 500 MG PO TB24
2000.0000 mg | ORAL_TABLET | Freq: Every day | ORAL | 0 refills | Status: DC
  Filled 2022-03-24: qty 120, 30d supply, fill #0

## 2022-03-25 DIAGNOSIS — N898 Other specified noninflammatory disorders of vagina: Secondary | ICD-10-CM | POA: Diagnosis not present

## 2022-03-25 DIAGNOSIS — Z6825 Body mass index (BMI) 25.0-25.9, adult: Secondary | ICD-10-CM | POA: Diagnosis not present

## 2022-03-25 DIAGNOSIS — Z789 Other specified health status: Secondary | ICD-10-CM | POA: Diagnosis not present

## 2022-03-25 DIAGNOSIS — L292 Pruritus vulvae: Secondary | ICD-10-CM | POA: Diagnosis not present

## 2022-03-29 ENCOUNTER — Other Ambulatory Visit (HOSPITAL_COMMUNITY): Payer: Self-pay

## 2022-03-30 ENCOUNTER — Ambulatory Visit: Payer: Self-pay | Admitting: Physician Assistant

## 2022-03-31 ENCOUNTER — Encounter: Payer: Self-pay | Admitting: *Deleted

## 2022-04-02 ENCOUNTER — Other Ambulatory Visit (HOSPITAL_COMMUNITY): Payer: Self-pay

## 2022-04-13 DIAGNOSIS — Z6826 Body mass index (BMI) 26.0-26.9, adult: Secondary | ICD-10-CM | POA: Diagnosis not present

## 2022-04-13 DIAGNOSIS — E282 Polycystic ovarian syndrome: Secondary | ICD-10-CM | POA: Diagnosis not present

## 2022-04-13 DIAGNOSIS — F418 Other specified anxiety disorders: Secondary | ICD-10-CM | POA: Diagnosis not present

## 2022-04-13 DIAGNOSIS — K5909 Other constipation: Secondary | ICD-10-CM | POA: Diagnosis not present

## 2022-04-25 ENCOUNTER — Other Ambulatory Visit (HOSPITAL_COMMUNITY): Payer: Self-pay

## 2022-04-25 MED ORDER — METFORMIN HCL ER 500 MG PO TB24
2000.0000 mg | ORAL_TABLET | Freq: Every day | ORAL | 0 refills | Status: DC
  Filled 2022-04-25: qty 90, 22d supply, fill #0

## 2022-04-29 ENCOUNTER — Other Ambulatory Visit (HOSPITAL_COMMUNITY): Payer: Self-pay

## 2022-05-11 DIAGNOSIS — Z6827 Body mass index (BMI) 27.0-27.9, adult: Secondary | ICD-10-CM | POA: Diagnosis not present

## 2022-05-11 DIAGNOSIS — J029 Acute pharyngitis, unspecified: Secondary | ICD-10-CM | POA: Diagnosis not present

## 2022-05-21 DIAGNOSIS — R0981 Nasal congestion: Secondary | ICD-10-CM | POA: Diagnosis not present

## 2022-05-21 DIAGNOSIS — J00 Acute nasopharyngitis [common cold]: Secondary | ICD-10-CM | POA: Diagnosis not present

## 2022-05-21 DIAGNOSIS — Z6826 Body mass index (BMI) 26.0-26.9, adult: Secondary | ICD-10-CM | POA: Diagnosis not present

## 2022-05-26 ENCOUNTER — Other Ambulatory Visit (HOSPITAL_COMMUNITY): Payer: Self-pay

## 2022-05-26 DIAGNOSIS — E282 Polycystic ovarian syndrome: Secondary | ICD-10-CM | POA: Diagnosis not present

## 2022-05-26 DIAGNOSIS — K5903 Drug induced constipation: Secondary | ICD-10-CM | POA: Diagnosis not present

## 2022-05-26 DIAGNOSIS — R632 Polyphagia: Secondary | ICD-10-CM | POA: Diagnosis not present

## 2022-05-26 DIAGNOSIS — E559 Vitamin D deficiency, unspecified: Secondary | ICD-10-CM | POA: Diagnosis not present

## 2022-05-26 MED ORDER — ERGOCALCIFEROL 1.25 MG (50000 UT) PO CAPS
50000.0000 [IU] | ORAL_CAPSULE | ORAL | 1 refills | Status: DC
  Filled 2022-05-26: qty 12, 84d supply, fill #0
  Filled 2022-08-20 – 2022-08-30 (×2): qty 12, 84d supply, fill #1

## 2022-05-26 MED ORDER — METFORMIN HCL ER 500 MG PO TB24
2000.0000 mg | ORAL_TABLET | Freq: Every day | ORAL | 1 refills | Status: AC
  Filled 2022-05-26: qty 360, 90d supply, fill #0
  Filled 2022-08-20 – 2022-08-30 (×2): qty 360, 90d supply, fill #1

## 2022-05-27 ENCOUNTER — Other Ambulatory Visit (HOSPITAL_COMMUNITY): Payer: Self-pay

## 2022-05-27 DIAGNOSIS — Z01419 Encounter for gynecological examination (general) (routine) without abnormal findings: Secondary | ICD-10-CM | POA: Diagnosis not present

## 2022-05-27 DIAGNOSIS — Z6827 Body mass index (BMI) 27.0-27.9, adult: Secondary | ICD-10-CM | POA: Diagnosis not present

## 2022-05-27 DIAGNOSIS — Z124 Encounter for screening for malignant neoplasm of cervix: Secondary | ICD-10-CM | POA: Diagnosis not present

## 2022-05-27 DIAGNOSIS — Z1231 Encounter for screening mammogram for malignant neoplasm of breast: Secondary | ICD-10-CM | POA: Diagnosis not present

## 2022-05-27 MED ORDER — SUCRALFATE 1 G PO TABS
1.0000 g | ORAL_TABLET | Freq: Four times a day (QID) | ORAL | 1 refills | Status: DC
  Filled 2022-05-27: qty 40, 10d supply, fill #0

## 2022-06-04 ENCOUNTER — Other Ambulatory Visit (HOSPITAL_COMMUNITY): Payer: Self-pay

## 2022-06-15 DIAGNOSIS — Z6827 Body mass index (BMI) 27.0-27.9, adult: Secondary | ICD-10-CM | POA: Diagnosis not present

## 2022-06-15 DIAGNOSIS — B001 Herpesviral vesicular dermatitis: Secondary | ICD-10-CM | POA: Diagnosis not present

## 2022-07-14 ENCOUNTER — Other Ambulatory Visit (HOSPITAL_COMMUNITY): Payer: Self-pay

## 2022-07-14 DIAGNOSIS — Z13220 Encounter for screening for lipoid disorders: Secondary | ICD-10-CM | POA: Diagnosis not present

## 2022-07-14 DIAGNOSIS — E663 Overweight: Secondary | ICD-10-CM | POA: Diagnosis not present

## 2022-07-14 DIAGNOSIS — E559 Vitamin D deficiency, unspecified: Secondary | ICD-10-CM | POA: Diagnosis not present

## 2022-07-14 DIAGNOSIS — Z6826 Body mass index (BMI) 26.0-26.9, adult: Secondary | ICD-10-CM | POA: Diagnosis not present

## 2022-07-14 DIAGNOSIS — E282 Polycystic ovarian syndrome: Secondary | ICD-10-CM | POA: Diagnosis not present

## 2022-07-14 MED ORDER — METFORMIN HCL ER 500 MG PO TB24
2000.0000 mg | ORAL_TABLET | Freq: Every day | ORAL | 1 refills | Status: DC
  Filled 2022-07-14 – 2022-12-08 (×2): qty 360, 90d supply, fill #0
  Filled 2023-03-07: qty 360, 90d supply, fill #1

## 2022-07-15 ENCOUNTER — Other Ambulatory Visit (HOSPITAL_COMMUNITY): Payer: Self-pay

## 2022-07-22 ENCOUNTER — Other Ambulatory Visit (HOSPITAL_COMMUNITY): Payer: Self-pay

## 2022-07-25 ENCOUNTER — Other Ambulatory Visit (HOSPITAL_COMMUNITY): Payer: Self-pay

## 2022-07-25 MED ORDER — ZEPBOUND 15 MG/0.5ML ~~LOC~~ SOAJ
15.0000 mg | SUBCUTANEOUS | 0 refills | Status: DC
  Filled 2022-07-25: qty 2, 28d supply, fill #0

## 2022-07-28 ENCOUNTER — Other Ambulatory Visit (HOSPITAL_COMMUNITY): Payer: Self-pay

## 2022-08-11 DIAGNOSIS — E282 Polycystic ovarian syndrome: Secondary | ICD-10-CM | POA: Diagnosis not present

## 2022-08-11 DIAGNOSIS — Z9189 Other specified personal risk factors, not elsewhere classified: Secondary | ICD-10-CM | POA: Diagnosis not present

## 2022-08-11 DIAGNOSIS — F418 Other specified anxiety disorders: Secondary | ICD-10-CM | POA: Diagnosis not present

## 2022-08-11 DIAGNOSIS — R632 Polyphagia: Secondary | ICD-10-CM | POA: Diagnosis not present

## 2022-08-29 ENCOUNTER — Other Ambulatory Visit (HOSPITAL_COMMUNITY): Payer: Self-pay

## 2022-08-30 ENCOUNTER — Other Ambulatory Visit (HOSPITAL_COMMUNITY): Payer: Self-pay

## 2022-09-01 ENCOUNTER — Other Ambulatory Visit (HOSPITAL_COMMUNITY): Payer: Self-pay

## 2022-09-01 ENCOUNTER — Other Ambulatory Visit: Payer: Self-pay

## 2022-09-01 MED ORDER — ZEPBOUND 15 MG/0.5ML ~~LOC~~ SOAJ
15.0000 mg | SUBCUTANEOUS | 0 refills | Status: DC
  Filled 2022-09-01 – 2022-09-19 (×3): qty 2, 28d supply, fill #0

## 2022-09-15 ENCOUNTER — Other Ambulatory Visit (HOSPITAL_COMMUNITY): Payer: Self-pay

## 2022-09-15 DIAGNOSIS — N941 Unspecified dyspareunia: Secondary | ICD-10-CM | POA: Diagnosis not present

## 2022-09-15 DIAGNOSIS — N763 Subacute and chronic vulvitis: Secondary | ICD-10-CM | POA: Diagnosis not present

## 2022-09-15 MED ORDER — ESTRADIOL 0.1 MG/GM VA CREA
TOPICAL_CREAM | VAGINAL | 3 refills | Status: DC
  Filled 2022-09-15: qty 42.5, 90d supply, fill #0
  Filled 2022-12-08: qty 42.5, 90d supply, fill #1
  Filled 2023-05-02 – 2023-05-17 (×2): qty 42.5, 90d supply, fill #2

## 2022-09-15 MED ORDER — TRIAMCINOLONE ACETONIDE 0.1 % EX OINT
TOPICAL_OINTMENT | CUTANEOUS | 1 refills | Status: AC
  Filled 2022-09-15: qty 30, 14d supply, fill #0
  Filled 2022-10-16: qty 30, 14d supply, fill #1

## 2022-09-17 ENCOUNTER — Other Ambulatory Visit (HOSPITAL_COMMUNITY): Payer: Self-pay

## 2022-09-19 ENCOUNTER — Other Ambulatory Visit (HOSPITAL_COMMUNITY): Payer: Self-pay

## 2022-09-19 ENCOUNTER — Other Ambulatory Visit: Payer: Self-pay

## 2022-09-22 ENCOUNTER — Other Ambulatory Visit (HOSPITAL_COMMUNITY): Payer: Self-pay

## 2022-09-22 DIAGNOSIS — R632 Polyphagia: Secondary | ICD-10-CM | POA: Diagnosis not present

## 2022-09-22 DIAGNOSIS — E7801 Familial hypercholesterolemia: Secondary | ICD-10-CM | POA: Diagnosis not present

## 2022-09-22 DIAGNOSIS — E282 Polycystic ovarian syndrome: Secondary | ICD-10-CM | POA: Diagnosis not present

## 2022-09-22 DIAGNOSIS — Z8639 Personal history of other endocrine, nutritional and metabolic disease: Secondary | ICD-10-CM | POA: Diagnosis not present

## 2022-09-22 MED ORDER — ZEPBOUND 15 MG/0.5ML ~~LOC~~ SOAJ
15.0000 mg | SUBCUTANEOUS | 0 refills | Status: DC
  Filled 2022-10-16: qty 2, 28d supply, fill #0

## 2022-10-13 ENCOUNTER — Other Ambulatory Visit (HOSPITAL_COMMUNITY): Payer: Self-pay

## 2022-10-17 ENCOUNTER — Other Ambulatory Visit (HOSPITAL_COMMUNITY): Payer: Self-pay

## 2022-11-10 DIAGNOSIS — K5903 Drug induced constipation: Secondary | ICD-10-CM | POA: Diagnosis not present

## 2022-11-10 DIAGNOSIS — Z8639 Personal history of other endocrine, nutritional and metabolic disease: Secondary | ICD-10-CM | POA: Diagnosis not present

## 2022-11-10 DIAGNOSIS — E282 Polycystic ovarian syndrome: Secondary | ICD-10-CM | POA: Diagnosis not present

## 2022-11-10 DIAGNOSIS — E559 Vitamin D deficiency, unspecified: Secondary | ICD-10-CM | POA: Diagnosis not present

## 2022-12-08 ENCOUNTER — Other Ambulatory Visit (HOSPITAL_COMMUNITY): Payer: Self-pay

## 2022-12-12 ENCOUNTER — Other Ambulatory Visit (HOSPITAL_COMMUNITY): Payer: Self-pay

## 2022-12-29 ENCOUNTER — Other Ambulatory Visit (HOSPITAL_COMMUNITY): Payer: Self-pay

## 2022-12-29 DIAGNOSIS — Z8639 Personal history of other endocrine, nutritional and metabolic disease: Secondary | ICD-10-CM | POA: Diagnosis not present

## 2022-12-29 DIAGNOSIS — R632 Polyphagia: Secondary | ICD-10-CM | POA: Diagnosis not present

## 2022-12-29 DIAGNOSIS — E282 Polycystic ovarian syndrome: Secondary | ICD-10-CM | POA: Diagnosis not present

## 2022-12-29 DIAGNOSIS — F439 Reaction to severe stress, unspecified: Secondary | ICD-10-CM | POA: Diagnosis not present

## 2022-12-29 MED ORDER — ZEPBOUND 15 MG/0.5ML ~~LOC~~ SOAJ
15.0000 mg | SUBCUTANEOUS | 1 refills | Status: DC
  Filled 2022-12-29: qty 2, 28d supply, fill #0
  Filled 2023-01-25: qty 2, 28d supply, fill #1

## 2022-12-29 MED ORDER — ESTRADIOL 0.1 MG/GM VA CREA
0.5000 | TOPICAL_CREAM | VAGINAL | 1 refills | Status: AC
  Filled 2022-12-29: qty 42.5, 90d supply, fill #0
  Filled 2023-02-26: qty 42.5, 84d supply, fill #0
  Filled 2023-08-08: qty 42.5, 84d supply, fill #1

## 2022-12-30 ENCOUNTER — Other Ambulatory Visit (HOSPITAL_COMMUNITY): Payer: Self-pay

## 2023-01-02 ENCOUNTER — Other Ambulatory Visit (HOSPITAL_COMMUNITY): Payer: Self-pay

## 2023-01-25 ENCOUNTER — Other Ambulatory Visit (HOSPITAL_COMMUNITY): Payer: Self-pay

## 2023-01-26 ENCOUNTER — Other Ambulatory Visit: Payer: Self-pay

## 2023-01-31 ENCOUNTER — Other Ambulatory Visit (HOSPITAL_COMMUNITY): Payer: Self-pay

## 2023-02-03 ENCOUNTER — Other Ambulatory Visit (HOSPITAL_COMMUNITY): Payer: Self-pay

## 2023-02-09 DIAGNOSIS — D1801 Hemangioma of skin and subcutaneous tissue: Secondary | ICD-10-CM | POA: Diagnosis not present

## 2023-02-26 ENCOUNTER — Other Ambulatory Visit (HOSPITAL_COMMUNITY): Payer: Self-pay

## 2023-02-27 ENCOUNTER — Other Ambulatory Visit (HOSPITAL_COMMUNITY): Payer: Self-pay

## 2023-02-27 ENCOUNTER — Other Ambulatory Visit: Payer: Self-pay

## 2023-02-27 MED ORDER — VITAMIN D (ERGOCALCIFEROL) 1.25 MG (50000 UNIT) PO CAPS
50000.0000 [IU] | ORAL_CAPSULE | ORAL | 1 refills | Status: AC
  Filled 2023-02-27: qty 12, 84d supply, fill #0

## 2023-02-27 MED ORDER — ZEPBOUND 15 MG/0.5ML ~~LOC~~ SOAJ
15.0000 mg | SUBCUTANEOUS | 1 refills | Status: AC
  Filled 2023-02-27: qty 2, 28d supply, fill #0
  Filled 2023-05-02 – 2023-08-08 (×2): qty 2, 28d supply, fill #1

## 2023-03-23 ENCOUNTER — Other Ambulatory Visit (HOSPITAL_COMMUNITY): Payer: Self-pay

## 2023-03-23 DIAGNOSIS — Z8639 Personal history of other endocrine, nutritional and metabolic disease: Secondary | ICD-10-CM | POA: Diagnosis not present

## 2023-03-23 DIAGNOSIS — E559 Vitamin D deficiency, unspecified: Secondary | ICD-10-CM | POA: Diagnosis not present

## 2023-03-23 DIAGNOSIS — E282 Polycystic ovarian syndrome: Secondary | ICD-10-CM | POA: Diagnosis not present

## 2023-03-23 DIAGNOSIS — R632 Polyphagia: Secondary | ICD-10-CM | POA: Diagnosis not present

## 2023-03-23 MED ORDER — ZEPBOUND 15 MG/0.5ML ~~LOC~~ SOAJ
15.0000 mg | SUBCUTANEOUS | 1 refills | Status: AC
  Filled 2023-03-23 – 2023-04-03 (×2): qty 2, 28d supply, fill #0

## 2023-04-03 ENCOUNTER — Other Ambulatory Visit (HOSPITAL_COMMUNITY): Payer: Self-pay

## 2023-04-06 IMAGING — DX DG OS CALCIS 2+V*L*
2 series · 2 of 2 positions shown · non-contrast
Comparison: None.

CLINICAL DATA: 39-year-old female with left foot pain. Concern for
plantar fasciitis.

EXAM:
LEFT OS CALCIS - 2+ VIEW

[calcaneus axial]
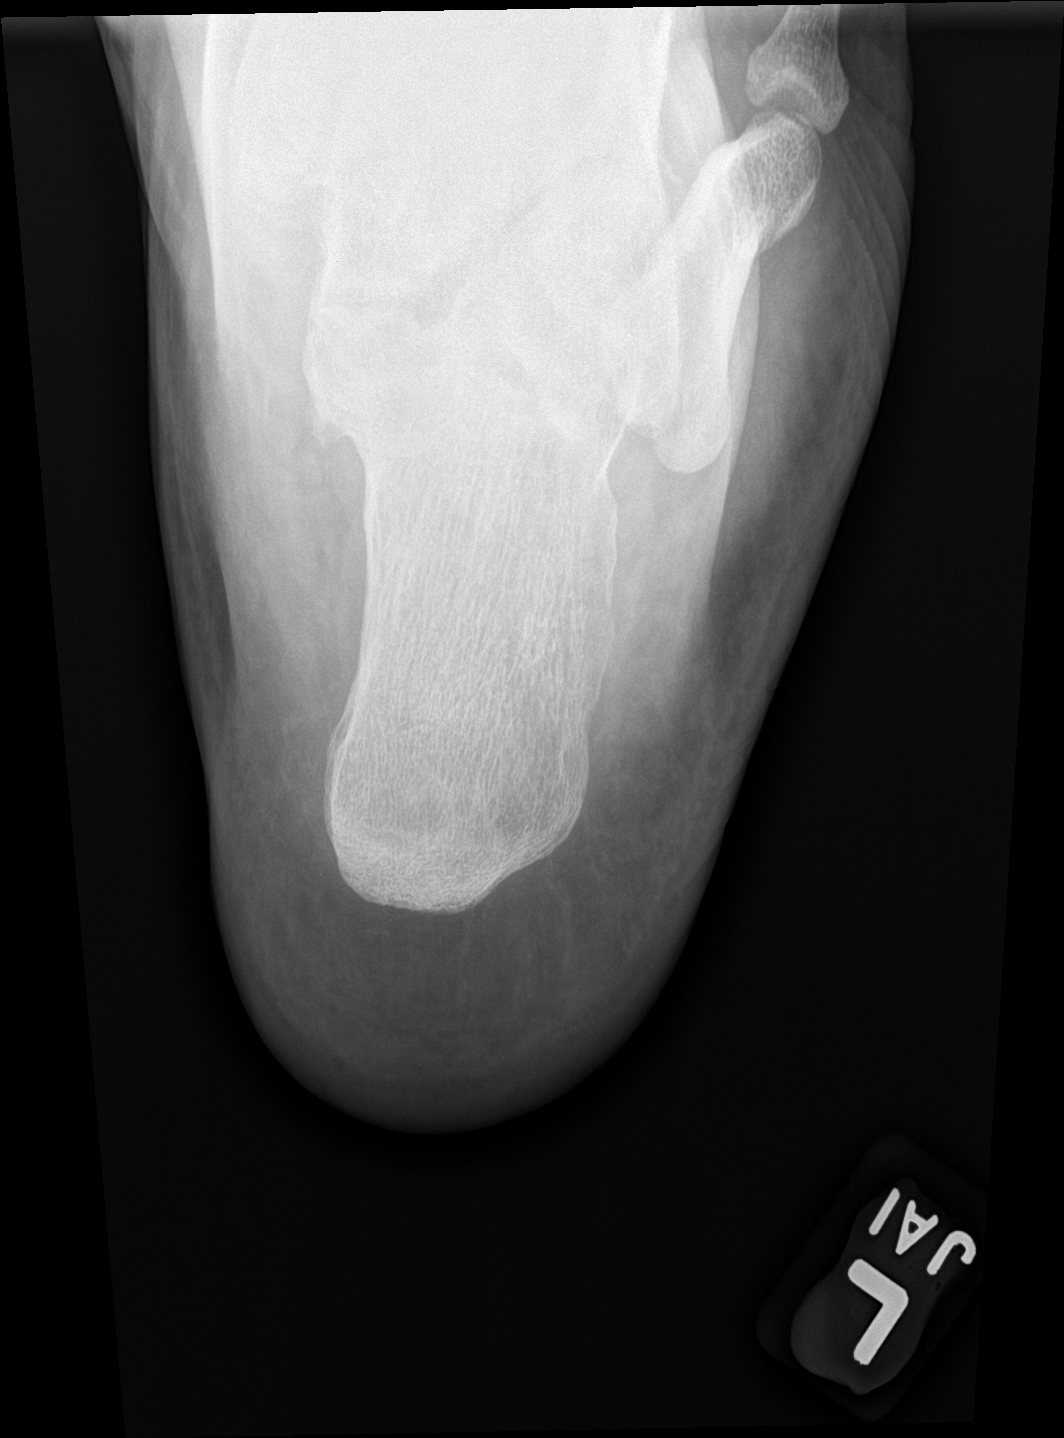

[calcaneus lat]
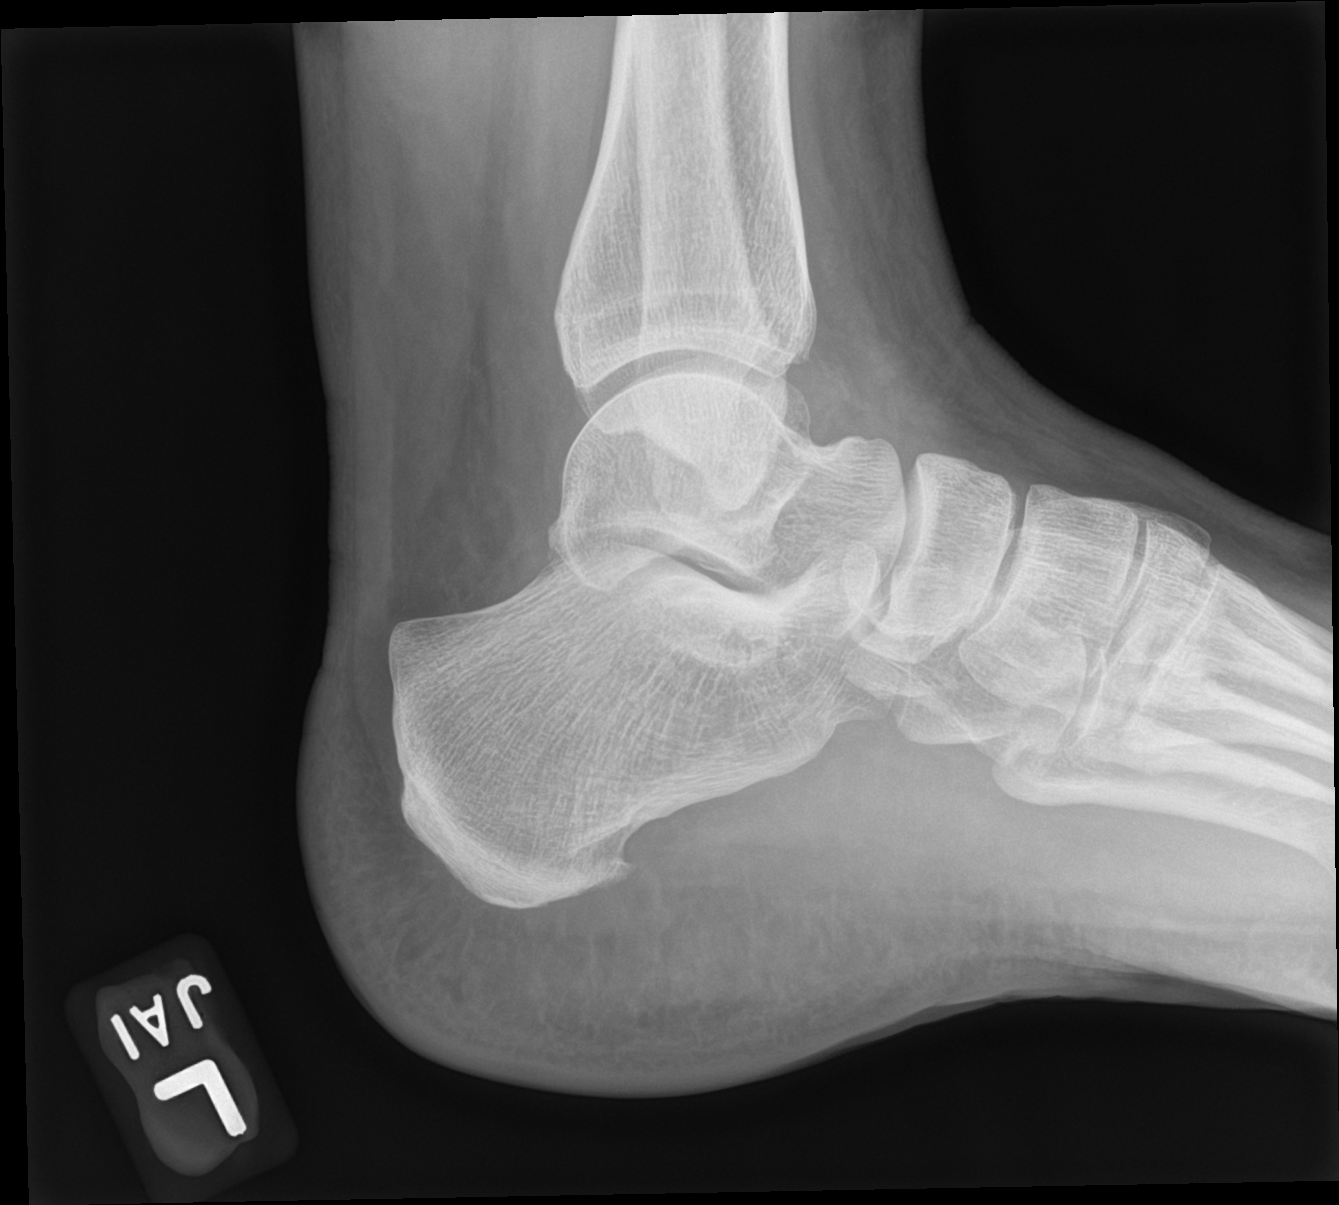

[2 of 2 positions shown; findings below may reference images not displayed]

FINDINGS: There is no evidence of fracture or other focal bone lesions. Soft
tissues are unremarkable.
IMPRESSION: Negative.

## 2023-04-10 ENCOUNTER — Other Ambulatory Visit (HOSPITAL_COMMUNITY): Payer: Self-pay

## 2023-04-15 DIAGNOSIS — R079 Chest pain, unspecified: Secondary | ICD-10-CM | POA: Diagnosis not present

## 2023-04-15 DIAGNOSIS — J029 Acute pharyngitis, unspecified: Secondary | ICD-10-CM | POA: Diagnosis not present

## 2023-04-15 DIAGNOSIS — Z03818 Encounter for observation for suspected exposure to other biological agents ruled out: Secondary | ICD-10-CM | POA: Diagnosis not present

## 2023-04-20 DIAGNOSIS — F4322 Adjustment disorder with anxiety: Secondary | ICD-10-CM | POA: Diagnosis not present

## 2023-04-23 ENCOUNTER — Emergency Department (HOSPITAL_BASED_OUTPATIENT_CLINIC_OR_DEPARTMENT_OTHER): Payer: Self-pay | Admitting: Radiology

## 2023-04-23 ENCOUNTER — Other Ambulatory Visit: Payer: Self-pay

## 2023-04-23 ENCOUNTER — Emergency Department (HOSPITAL_BASED_OUTPATIENT_CLINIC_OR_DEPARTMENT_OTHER)
Admission: EM | Admit: 2023-04-23 | Discharge: 2023-04-23 | Disposition: A | Discharge status: Home / Self Care | Payer: Self-pay | Attending: Emergency Medicine | Admitting: Emergency Medicine

## 2023-04-23 ENCOUNTER — Encounter (HOSPITAL_BASED_OUTPATIENT_CLINIC_OR_DEPARTMENT_OTHER): Payer: Self-pay | Admitting: Emergency Medicine

## 2023-04-23 DIAGNOSIS — R0789 Other chest pain: Secondary | ICD-10-CM | POA: Diagnosis not present

## 2023-04-23 DIAGNOSIS — I10 Essential (primary) hypertension: Secondary | ICD-10-CM | POA: Insufficient documentation

## 2023-04-23 DIAGNOSIS — Z7984 Long term (current) use of oral hypoglycemic drugs: Secondary | ICD-10-CM | POA: Insufficient documentation

## 2023-04-23 DIAGNOSIS — R079 Chest pain, unspecified: Secondary | ICD-10-CM

## 2023-04-23 DIAGNOSIS — Z7982 Long term (current) use of aspirin: Secondary | ICD-10-CM | POA: Diagnosis not present

## 2023-04-23 DIAGNOSIS — R002 Palpitations: Secondary | ICD-10-CM | POA: Diagnosis not present

## 2023-04-23 LAB — CBC
HCT: 38.6 % (ref 36.0–46.0)
Hemoglobin: 12.8 g/dL (ref 12.0–15.0)
MCH: 28.1 pg (ref 26.0–34.0)
MCHC: 33.2 g/dL (ref 30.0–36.0)
MCV: 84.8 fL (ref 80.0–100.0)
Platelets: 300 10*3/uL (ref 150–400)
RBC: 4.55 MIL/uL (ref 3.87–5.11)
RDW: 13 % (ref 11.5–15.5)
WBC: 5.9 10*3/uL (ref 4.0–10.5)
nRBC: 0 % (ref 0.0–0.2)

## 2023-04-23 LAB — TROPONIN I (HIGH SENSITIVITY)
Troponin I (High Sensitivity): <2 ng/L (ref <18)
Troponin I (High Sensitivity): <2 ng/L (ref <18)

## 2023-04-23 LAB — BASIC METABOLIC PANEL
Anion gap: 8 (ref 5–15)
BUN: 10 mg/dL (ref 6–20)
CO2: 24 mmol/L (ref 22–32)
Calcium: 8.4 mg/dL — ABNORMAL LOW (ref 8.9–10.3)
Chloride: 106 mmol/L (ref 98–111)
Creatinine, Ser: 0.58 mg/dL (ref 0.44–1.00)
GFR, Estimated: >60 mL/min (ref >60)
Glucose, Bld: 90 mg/dL (ref 70–99)
Potassium: 3.7 mmol/L (ref 3.5–5.1)
Sodium: 138 mmol/L (ref 135–145)

## 2023-04-23 LAB — PREGNANCY, URINE: Preg Test, Ur: NEGATIVE

## 2023-04-23 NOTE — ED Triage Notes (Signed)
 C/o central CP that rads into both arms starting this morning. Had similar episode 2 weeks ago and went to [facility] and was dx w/ strep. Been taking antibiotics.

## 2023-04-23 NOTE — ED Provider Notes (Signed)
 [place] EMERGENCY DEPARTMENT AT [facility] Provider Note   CSN: [id number] Arrival date & time: 04/23/23  9163     History  Chief Complaint  Patient presents with   Chest Pain    Leah Moreno is a 43 y.o. female.  HPI     43yo female with history of PCOS, insulin  resistance, borderline hypertension, on chart review protein S deficiency/heterozygous prothrombin mutation (diagnosed by Dr. Nicholes F hematology due to family history of childhood stroke, on anticoagulation during pregnancy, no personal hx of DVT/PE) who presents with concern for chest pain.  2 weeks ago had anxiety, episode, did ECG at urgent care. Diagnosed with strep and is a carrier so not sure if that was accurate.  Was out lifting and doing stuff yesterday so thought maybe muscle soreness.  When woke up thisi AM feels like soreness, tightness in chest and in bilateral arms  Last time 2 weeks ago was like indigestion, felt unwell, then pain to arm-did have dyspnea that time but not this time Slight cough No nausea, vomiting nor diaphoresis.  Past Medical History:  Diagnosis Date   Allergy    Blood dyscrasia    Protein S deficient, prothrombing gene factor 2   Borderline hypertension 08/05/2011   Chicken pox    around 1987   Clotting disorder (HCC)    Protien S/ prothrombine gene factor   Constipation    IBS (irritable bowel syndrome)    Insulin  resistance    takes Metformin - has PCOS   PCOS (polycystic ovarian syndrome)    Dx about 7 yrs ago   Pregnancy induced hypertension 2013   Protein S deficiency complicating pregnancy - delivered 08/05/2011   Prothrombin gene mutation (HCC)    Thrombocytopenia due to blood loss 08/05/2011     Home Medications Prior to Admission medications   Medication Sig Start Date End Date Taking? Authorizing Provider  aspirin EC 81 MG tablet Take 81 mg by mouth daily.    [provider]  azithromycin  (ZITHROMAX  Z-PAK) 250 MG tablet Take 2 tablets  by mouth today, then 1 tablet for 4 days. 01/12/22     azithromycin  (ZITHROMAX ) 250 MG tablet Take 2 tablets by mouth on day 1, then 1 tablet each day until gone. 01/13/22     diphenhydrAMINE  (BENADRYL ) 25 MG tablet Take 25 mg by mouth at bedtime.    [provider]  ELDERBERRY PO Take by mouth.    [provider]  estradiol  (ESTRACE ) 0.1 MG/GM vaginal cream Insert 0.5 g vaginally twice weekly. 09/15/22     estradiol  (ESTRACE ) 0.1 MG/GM vaginal cream Place 0.5 mg vaginally 2 (two) times a week as directed 12/29/22     loratadine (CLARITIN) 10 MG tablet Take 10 mg by mouth daily.    [provider]  metFORMIN  (GLUCOPHAGE -XR) 500 MG 24 hr tablet Take 4 tablets (2,000 mg total) by mouth daily with breakfast. 10/28/20   Prentiss Frieze, DO  metFORMIN  (GLUCOPHAGE -XR) 500 MG 24 hr tablet Take 4 tablets (2,000 mg total) by mouth daily. 05/26/22     metFORMIN  (GLUCOPHAGE -XR) 500 MG 24 hr tablet Take 4 tablets (2,000 mg total) by mouth daily. 07/14/22     mometasone  (ELOCON ) 0.1 % cream Apply 1 application topically daily. Patient taking differently: Apply 1 application. topically as needed. 10/03/18   Jodie Lavern CROME, MD  Multiple Vitamins-Minerals (WOMENS MULTIVITAMIN PO) Take by mouth.    [provider]  ondansetron  (ZOFRAN  ODT) 4 MG disintegrating tablet Take 1 tablet (  4 mg total) by mouth every 6 (six) hours as needed for nausea or vomiting. 02/02/21   Prentiss Frieze, DO  orlistat  (ALLI ) 60 MG capsule Take 1 capsule (60 mg total) by mouth 3 (three) times daily with each meal 02/18/22     polyethylene glycol (MIRALAX  / GLYCOLAX ) packet Take 17 g by mouth daily as needed for mild constipation.    [provider]  Semaglutide -Weight Management (WEGOVY ) 0.25 MG/0.5ML SOAJ Inject 0.25mg  Subcutaneously weekly 11/03/21     tirzepatide  (MOUNJARO ) 5 MG/0.5ML Pen Inject 5 mg into the skin once a week. 07/20/21   Prentiss Frieze, DO  tirzepatide  (ZEPBOUND ) 15 MG/0.5ML Pen Inject 15  mg into the skin once a week. 02/27/23     tirzepatide  (ZEPBOUND ) 15 MG/0.5ML Pen Inject 15 mg into the skin once a week. 03/23/23     triamcinolone  ointment (KENALOG ) 0.1 % APPLY A THIN LAYER TO THE AFFECTED AREA(S) BY TOPICAL ROUTE 2 TIMES PER DAY 09/15/22     Vitamin D , Ergocalciferol , (DRISDOL ) 1.25 MG (50000 UNIT) CAPS capsule Take 1 capsule  by mouth every 7  days. 06/17/21   Prentiss Frieze, DO  Vitamin D , Ergocalciferol , (DRISDOL ) 1.25 MG (50000 UNIT) CAPS capsule Take 1 capsule (50,000 Units total) by mouth once a week. 02/27/23     sucralfate  (CARAFATE ) 1 g tablet Take 1 tablet (1 g total) by mouth 4 (four) times daily for 10 days. 05/27/22 09/15/22        Allergies    Naproxen and Zoloft  [sertraline  hcl]    Review of Systems   Review of Systems  Physical Exam Updated Vital Signs BP (!) 141/89   Pulse 97   Temp 98.3 F (36.8 C) (Oral)   Resp 13   Ht 5' 3 (1.6 m)   Wt 71 kg   SpO2 100%   BMI 27.73 kg/m  Physical Exam Vitals and nursing note reviewed.  Constitutional:      General: She is not in acute distress.    Appearance: She is well-developed. She is not diaphoretic.  HENT:     Head: Normocephalic and atraumatic.  Eyes:     Conjunctiva/sclera: Conjunctivae normal.  Cardiovascular:     Rate and Rhythm: Normal rate and regular rhythm.     Heart sounds: Normal heart sounds. No murmur heard.    No friction rub. No gallop.  Pulmonary:     Effort: Pulmonary effort is normal. No respiratory distress.     Breath sounds: Normal breath sounds. No wheezing or rales.  Chest:     Chest wall: Tenderness present.  Abdominal:     General: There is no distension.     Palpations: Abdomen is soft.     Tenderness: There is no abdominal tenderness. There is no guarding.  Musculoskeletal:        General: No tenderness.     Cervical back: Normal range of motion.  Skin:    General: Skin is warm and dry.     Findings: No erythema or rash.  Neurological:     Mental Status: She is  alert and oriented to person, place, and time.     ED Results / Procedures / Treatments   Labs (all labs ordered are listed, but only abnormal results are displayed) Labs Reviewed  BASIC METABOLIC PANEL - Abnormal; Notable for the following components:      Result Value   Calcium 8.4 (*)    All other components within normal limits  CBC  PREGNANCY, URINE  TROPONIN I (HIGH SENSITIVITY)  TROPONIN I (HIGH SENSITIVITY)    EKG EKG Interpretation Date/Time:  Sunday April 23 2023 08:49:53 EST Ventricular Rate:  108 PR Interval:  157 QRS Duration:  88 QT Interval:  345 QTC Calculation: 463 R Axis:   76  Text Interpretation: Sinus tachycardia Low voltage, precordial leads Anteroseptal infarct, old No significant change since last tracing Confirmed by Idolina Mantell ([id number]) on 04/23/2023 10:37:14 AM  Radiology DG Chest 2 View Result Date: 04/23/2023 CLINICAL DATA:  42 year old female with history of chest pain. EXAM: CHEST - 2 VIEW COMPARISON:  No priors. FINDINGS: Lung volumes are normal. No consolidative airspace disease. No pleural effusions. No pneumothorax. No pulmonary nodule or mass noted. Pulmonary vasculature and the cardiomediastinal silhouette are within normal limits. IMPRESSION: No radiographic evidence of acute cardiopulmonary disease. Electronically Signed   By: Daniel  Entrikin M.D.   On: 04/23/2023 09:32    Procedures Procedures    Medications Ordered in ED Medications - No data to display  ED Course/ Medical Decision Making/ A&P                                   42yo female with history of PCOS, insulin resistance, borderline hypertension, on chart review protein S deficiency/heterozygous prothrombin mutation (diagnosed by Dr. Knovich F hematology due to family history of childhood stroke, on anticoagulation during pregnancy, no personal hx of DVT/PE) who presents with concern for chest pain.  Differential diagnosis for chest pain includes pulmonary embolus,  dissection, pneumothorax, pneumonia, ACS, myocarditis, pericarditis.    EKG was done and evaluate by me and showed no acute ST changes and no signs of pericarditis.   Chest x-ray was done and evaluated by me and radiology and showed no sign of pneumonia or pneumothorax.   Patient is low risk [place], no dyspnea, no tachycardia, description of tightness to both arms not consistent with PE and have low suspicion for PE.  She is low risk HEART score and had delta troponins which were both negative.  Do not feel history or exam are consistent with aortic dissection.   Suspect pain related to lifting yesterday, however given history of palpitations in the past, episode of cp last week, will refer to Cardiology for palpitations and CP. Patient discharged in stable condition with understanding of reasons to return.          Final Clinical Impression(s) / ED Diagnoses Final diagnoses:  Chest pain, unspecified type  Palpitations    Rx / DC Orders ED Discharge Orders          Ordered    Ambulatory referral to Cardiology        01 /05/25 1210              Dreama Longs, MD 04/24/23 [phone]

## 2023-04-25 ENCOUNTER — Other Ambulatory Visit (HOSPITAL_BASED_OUTPATIENT_CLINIC_OR_DEPARTMENT_OTHER): Payer: Self-pay

## 2023-04-25 DIAGNOSIS — F418 Other specified anxiety disorders: Secondary | ICD-10-CM | POA: Diagnosis not present

## 2023-04-25 DIAGNOSIS — E282 Polycystic ovarian syndrome: Secondary | ICD-10-CM | POA: Diagnosis not present

## 2023-04-25 DIAGNOSIS — E559 Vitamin D deficiency, unspecified: Secondary | ICD-10-CM | POA: Diagnosis not present

## 2023-04-25 MED ORDER — ZEPBOUND 15 MG/0.5ML ~~LOC~~ SOAJ
15.0000 mg | SUBCUTANEOUS | 1 refills | Status: AC
  Filled 2023-04-25: qty 2, 28d supply, fill #0

## 2023-05-02 ENCOUNTER — Other Ambulatory Visit (HOSPITAL_BASED_OUTPATIENT_CLINIC_OR_DEPARTMENT_OTHER): Payer: Self-pay

## 2023-05-02 ENCOUNTER — Other Ambulatory Visit (HOSPITAL_COMMUNITY): Payer: Self-pay

## 2023-05-02 DIAGNOSIS — D280 Benign neoplasm of vulva: Secondary | ICD-10-CM | POA: Diagnosis not present

## 2023-05-02 DIAGNOSIS — L578 Other skin changes due to chronic exposure to nonionizing radiation: Secondary | ICD-10-CM | POA: Diagnosis not present

## 2023-05-02 DIAGNOSIS — D225 Melanocytic nevi of trunk: Secondary | ICD-10-CM | POA: Diagnosis not present

## 2023-05-02 DIAGNOSIS — L814 Other melanin hyperpigmentation: Secondary | ICD-10-CM | POA: Diagnosis not present

## 2023-05-09 DIAGNOSIS — F4322 Adjustment disorder with anxiety: Secondary | ICD-10-CM | POA: Diagnosis not present

## 2023-05-12 ENCOUNTER — Other Ambulatory Visit (HOSPITAL_COMMUNITY): Payer: Self-pay

## 2023-05-26 ENCOUNTER — Other Ambulatory Visit (HOSPITAL_COMMUNITY): Payer: Self-pay

## 2023-06-06 ENCOUNTER — Other Ambulatory Visit (HOSPITAL_COMMUNITY): Payer: Self-pay

## 2023-06-07 ENCOUNTER — Other Ambulatory Visit (HOSPITAL_COMMUNITY): Payer: Self-pay

## 2023-06-07 MED ORDER — METFORMIN HCL ER 500 MG PO TB24
2000.0000 mg | ORAL_TABLET | Freq: Every day | ORAL | 1 refills | Status: DC
  Filled 2023-06-07: qty 360, 90d supply, fill #0
  Filled 2023-09-08: qty 360, 90d supply, fill #1

## 2023-06-08 ENCOUNTER — Other Ambulatory Visit (HOSPITAL_COMMUNITY): Payer: Self-pay

## 2023-06-13 DIAGNOSIS — Z23 Encounter for immunization: Secondary | ICD-10-CM | POA: Diagnosis not present

## 2023-06-13 DIAGNOSIS — Z113 Encounter for screening for infections with a predominantly sexual mode of transmission: Secondary | ICD-10-CM | POA: Diagnosis not present

## 2023-06-13 DIAGNOSIS — Z0000 Encounter for general adult medical examination without abnormal findings: Secondary | ICD-10-CM | POA: Diagnosis not present

## 2023-06-13 DIAGNOSIS — Z111 Encounter for screening for respiratory tuberculosis: Secondary | ICD-10-CM | POA: Diagnosis not present

## 2023-06-22 DIAGNOSIS — R632 Polyphagia: Secondary | ICD-10-CM | POA: Diagnosis not present

## 2023-06-22 DIAGNOSIS — E663 Overweight: Secondary | ICD-10-CM | POA: Diagnosis not present

## 2023-06-22 DIAGNOSIS — E282 Polycystic ovarian syndrome: Secondary | ICD-10-CM | POA: Diagnosis not present

## 2023-06-22 DIAGNOSIS — E559 Vitamin D deficiency, unspecified: Secondary | ICD-10-CM | POA: Diagnosis not present

## 2023-06-27 ENCOUNTER — Other Ambulatory Visit (HOSPITAL_COMMUNITY): Payer: Self-pay

## 2023-06-27 DIAGNOSIS — Z1331 Encounter for screening for depression: Secondary | ICD-10-CM | POA: Diagnosis not present

## 2023-06-27 DIAGNOSIS — Z1231 Encounter for screening mammogram for malignant neoplasm of breast: Secondary | ICD-10-CM | POA: Diagnosis not present

## 2023-06-27 DIAGNOSIS — Z01419 Encounter for gynecological examination (general) (routine) without abnormal findings: Secondary | ICD-10-CM | POA: Diagnosis not present

## 2023-06-27 MED ORDER — TRIAMCINOLONE ACETONIDE 0.1 % EX OINT
TOPICAL_OINTMENT | Freq: Two times a day (BID) | CUTANEOUS | 1 refills | Status: AC
Start: 2023-06-27 — End: ?
  Filled 2023-06-27: qty 30, 15d supply, fill #0
  Filled 2023-08-08: qty 30, 15d supply, fill #1

## 2023-06-30 ENCOUNTER — Ambulatory Visit: Payer: BC Managed Care – PPO | Attending: Cardiovascular Disease | Admitting: Cardiovascular Disease

## 2023-06-30 ENCOUNTER — Encounter: Payer: Self-pay | Admitting: Cardiovascular Disease

## 2023-06-30 ENCOUNTER — Ambulatory Visit (INDEPENDENT_AMBULATORY_CARE_PROVIDER_SITE_OTHER)

## 2023-06-30 VITALS — BP 118/70 | HR 76 | Ht 63.0 in | Wt 152.0 lb

## 2023-06-30 DIAGNOSIS — R002 Palpitations: Secondary | ICD-10-CM | POA: Diagnosis not present

## 2023-06-30 DIAGNOSIS — R0789 Other chest pain: Secondary | ICD-10-CM | POA: Insufficient documentation

## 2023-06-30 DIAGNOSIS — R079 Chest pain, unspecified: Secondary | ICD-10-CM

## 2023-06-30 DIAGNOSIS — E785 Hyperlipidemia, unspecified: Secondary | ICD-10-CM | POA: Insufficient documentation

## 2023-06-30 DIAGNOSIS — E782 Mixed hyperlipidemia: Secondary | ICD-10-CM

## 2023-06-30 NOTE — Assessment & Plan Note (Signed)
 Occasional palpitations which cause her to occasionally take a deep breath.  She does drink occasional tea and soft drinks but does not drink coffee.  She does admit to anxiety.  Her sister has PVCs and PACs and her mother has PSVT.  I am going to get a 2-week Zio patch to further evaluate

## 2023-06-30 NOTE — Patient Instructions (Signed)
 Medication Instructions:  Your physician recommends that you continue on your current medications as directed. Please refer to the Current Medication list given to you today.  *If you need a refill on your cardiac medications before your next appointment, please call your pharmacy*   Testing/Procedures: Dr. Allyson Sabal has ordered a CT coronary calcium score.   Test locations:  [facility]  [place] [facility]  This is $99 out of pocket.   Coronary CalciumScan A coronary calcium scan is an imaging test used to look for deposits of calcium and other fatty materials (plaques) in the inner lining of the blood vessels of the heart (coronary arteries). These deposits of calcium and plaques can partly clog and narrow the coronary arteries without producing any symptoms or warning signs. This puts a person at risk for a heart attack. This test can detect these deposits before symptoms develop. Tell a health care provider about: Any allergies you have. All medicines you are taking, including vitamins, herbs, eye drops, creams, and over-the-counter medicines. Any problems you or family members have had with anesthetic medicines. Any blood disorders you have. Any surgeries you have had. Any medical conditions you have. Whether you are pregnant or may be pregnant. What are the risks? Generally, this is a safe procedure. However, problems may occur, including: Harm to a pregnant woman and her unborn baby. This test involves the use of radiation. Radiation exposure can be dangerous to a pregnant woman and her unborn baby. If you are pregnant, you generally should not have this procedure done. Slight increase in the risk of cancer. This is because of the radiation involved in the test. What happens before the procedure? No preparation is needed for this procedure. What happens during the procedure? You will undress and  remove any jewelry around your neck or chest. You will put on a hospital gown. Sticky electrodes will be placed on your chest. The electrodes will be connected to an electrocardiogram (ECG) machine to record a tracing of the electrical activity of your heart. A CT scanner will take pictures of your heart. During this time, you will be asked to lie still and hold your breath for 2-3 seconds while a picture of your heart is being taken. The procedure may vary among health care providers and hospitals. What happens after the procedure? You can get dressed. You can return to your normal activities. It is up to you to get the results of your test. Ask your health care provider, or the department that is doing the test, when your results will be ready. Summary A coronary calcium scan is an imaging test used to look for deposits of calcium and other fatty materials (plaques) in the inner lining of the blood vessels of the heart (coronary arteries). Generally, this is a safe procedure. Tell your health care provider if you are pregnant or may be pregnant. No preparation is needed for this procedure. A CT scanner will take pictures of your heart. You can return to your normal activities after the scan is done. This information is not intended to replace advice given to you by your health care provider. Make sure you discuss any questions you have with your health care provider. Document Released: 10/01/2007 Document Revised: 02/22/2016 Document Reviewed: 02/22/2016 Elsevier Interactive Patient Education  2017 Elsevier Inc.   Leah Moreno- Long Term Monitor Instructions  Your physician has requested you wear a ZIO patch monitor for 14 days.  This is a single patch monitor. [employer] supplies one patch monitor per enrollment. Additional stickers are not available. Please do not apply patch if you will be having a Nuclear Stress Test,  Echocardiogram, Cardiac CT, MRI, or Chest Xray during the period you would  be wearing the  monitor. The patch cannot be worn during these tests. You cannot remove and re-apply the  ZIO XT patch monitor.  Your ZIO patch monitor will be mailed 3 day USPS to your address on file. It may take 3-5 days  to receive your monitor after you have been enrolled.  Once you have received your monitor, please review the enclosed instructions. Your monitor  has already been registered assigning a specific monitor serial # to you.  Billing and Patient Assistance Program Information  We have supplied [employer] with any of your insurance information on file for billing purposes. [employer] offers a sliding scale Patient Assistance Program for patients that do not have  insurance, or whose insurance does not completely cover the cost of the ZIO monitor.  You must apply for the Patient Assistance Program to qualify for this discounted rate.  To apply, please call [employer] at [phone], select option 4, select option 2, ask to apply for  Patient Assistance Program. Meredeth Ide will ask your household income, and how many people  are in your household. They will quote your out-of-pocket cost based on that information.  [employer] will also be able to set up a 35-month, interest-free payment plan if needed.  Applying the monitor   Shave hair from upper left chest.  Hold abrader disc by orange tab. Rub abrader in 40 strokes over the upper left chest as  indicated in your monitor instructions.  Clean area with 4 enclosed alcohol pads. Let dry.  Apply patch as indicated in monitor instructions. Patch will be placed under collarbone on left  side of chest with arrow pointing upward.  Rub patch adhesive wings for 2 minutes. Remove white label marked "1". Remove the white  label marked "2". Rub patch adhesive wings for 2 additional minutes.  While looking in a mirror, press and release button in center of patch. A small green light will  flash 3-4 times. This will be your only indicator that the  monitor has been turned on.  Do not shower for the first 24 hours. You may shower after the first 24 hours.  Press the button if you feel a symptom. You will hear a small click. Record Date, Time and  Symptom in the Patient Logbook.  When you are ready to remove the patch, follow instructions on the last 2 pages of Patient  Logbook. Stick patch monitor onto the last page of Patient Logbook.  Place Patient Logbook in the blue and white box. Use locking tab on box and tape box closed  securely. The blue and white box has prepaid postage on it. Please place it in the mailbox as  soon as possible. Your physician should have your test results approximately 7 days after the  monitor has been mailed back to [facility].  Call [facility] Customer Care at [phone] if you have questions regarding  your ZIO XT patch monitor. Call them immediately if you see an orange light blinking on your  monitor.  If your monitor falls off in less than 4 days, contact our Monitor department at [phone].  If your monitor becomes loose or falls off after 4 days call [employer] at [phone] for  suggestions on securing your monitor  Follow-Up: At [facility], you and your health needs are our priority.  As part of our continuing mission to provide you with exceptional heart care, we have created designated Provider Care Teams.  These Care Teams include your primary Cardiologist (physician) and Advanced Practice Providers (APPs -  Physician Assistants and Nurse Practitioners) who all work together to provide you with the care you need, when you need it.  We recommend signing up for the patient portal called "MyChart".  Sign up information is provided on this After Visit Summary.  MyChart is used to connect with patients for Virtual Visits (Telemedicine).  Patients are able to view lab/test results, encounter notes, upcoming appointments, etc.  Non-urgent messages can be sent to your provider as  well.   To learn more about what you can do with MyChart, go to [email or web address].    Your next appointment:   We will see you on an as needed basis  Provider:   Nanetta Batty, MD   Other Instructions   1st Floor: - Lobby - Registration  - Pharmacy  - Lab - Cafe  2nd Floor: - PV Lab - Diagnostic Testing (echo, CT, nuclear med)  3rd Floor: - Vacant  4th Floor: - TCTS (cardiothoracic surgery) - AFib Clinic - Structural Heart Clinic - Vascular Surgery  - Vascular Ultrasound  5th Floor: - HeartCare Cardiology (general and EP) - Clinical Pharmacy for coumadin, hypertension, lipid, weight-loss medications, and med management appointments    Valet parking services will be available as well.

## 2023-06-30 NOTE — Assessment & Plan Note (Signed)
 Recent lipid profile performed 07/14/2022 revealed total cholesterol 190, LDL 129 HDL 46.  She is not sure whether this was fasting.  She is going to have her PCP recheck.  I am going to get a coronary calcium score to her stratify

## 2023-06-30 NOTE — Progress Notes (Unsigned)
 Enrolled for [employer] to mail a ZIO XT long term holter monitor to the patients address on file.

## 2023-06-30 NOTE — Assessment & Plan Note (Signed)
 Patient had episode of atypical chest pain which brought her to the emergency room 04/23/2023 with a negative workup.  She thought it was related to anxiety versus excessive lifting.  She has had no recurrent symptoms.  Her only other risk factor is mild untreated hyperlipidemia.  I am going to check a coronary calcium score to her stratify.

## 2023-06-30 NOTE — Progress Notes (Signed)
 06/30/2023 Leah Moreno   07/18/1980  [id number]  Primary Physician Masneri, Wille Celeste, DO Primary Cardiologist: Runell Gess MD Nicholes Calamity, [place]  HPI:  Leah Moreno is a 43 y.o. 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  She really has no cardiac risk factors.  She is never smoked.  There is no family history for heart disease.  She is never had a heart attack or stroke.  She ordinarily denies chest pain or shortness of breath.  She does have a history of protein S deficiency.  She is active raising 4 children but does not specifically exercise.  She was seen in the ER for atypical chest pain 04/23/2023.  Does admit to periodic anxiety.  She has had no recurrent symptoms.  She does get a occasional episodes of "palpitations" that are characterized by "losing her breath".   Current Meds  Medication Sig   aspirin EC 81 MG tablet Take 81 mg by mouth daily.   estradiol (ESTRACE) 0.1 MG/GM vaginal cream Place 0.5 mg vaginally 2 (two) times a week as directed   loratadine (CLARITIN) 10 MG tablet Take 10 mg by mouth daily.   metFORMIN (GLUCOPHAGE-XR) 500 MG 24 hr tablet Take 4 tablets (2,000 mg total) by mouth daily with breakfast.   metFORMIN (GLUCOPHAGE-XR) 500 MG 24 hr tablet Take 4 tablets (2,000 mg total) by mouth daily.   metFORMIN (GLUCOPHAGE-XR) 500 MG 24 hr tablet Take 4 tablets (2,000 mg total) by mouth daily.   mometasone (ELOCON) 0.1 % cream Apply 1 application topically daily. (Patient taking differently: Apply 1 application  topically as needed.)   Multiple Vitamins-Minerals (WOMENS MULTIVITAMIN PO) Take by mouth.   polyethylene glycol (MIRALAX / GLYCOLAX) packet Take 17 g by mouth daily as needed for mild constipation.   tirzepatide (ZEPBOUND) 15 MG/0.5ML Pen Inject 15 mg into the skin once a week.   tirzepatide (ZEPBOUND) 15  MG/0.5ML Pen Inject 15 mg into the skin once a week.   tirzepatide (ZEPBOUND) 15 MG/0.5ML Pen Inject 15 mg into the skin once a week.   triamcinolone ointment (KENALOG) 0.1 % APPLY A THIN LAYER TO THE AFFECTED AREA(S) BY TOPICAL ROUTE 2 TIMES PER DAY   triamcinolone ointment (KENALOG) 0.1 % Apply a thin layer to the affected area(s) twice daily   Vitamin D, Ergocalciferol, (DRISDOL) 1.25 MG (50000 UNIT) CAPS capsule Take 1 capsule  by mouth every 7  days.   Vitamin D, Ergocalciferol, (DRISDOL) 1.25 MG (50000 UNIT) CAPS capsule Take 1 capsule (50,000 Units total) by mouth once a week.   [DISCONTINUED] ondansetron (ZOFRAN ODT) 4 MG disintegrating tablet Take 1 tablet (4 mg total) by mouth every 6 (six) hours as needed for nausea or vomiting.   [DISCONTINUED] orlistat (ALLI) 60 MG capsule Take 1 capsule (60 mg total) by mouth 3 (three) times daily with each meal   [DISCONTINUED] Semaglutide-Weight Management (WEGOVY) 0.25 MG/0.5ML SOAJ Inject 0.25mg  Subcutaneously weekly   [DISCONTINUED] tirzepatide (MOUNJARO) 5 MG/0.5ML Pen Inject 5 mg into the skin once a week.     Allergies  Allergen Reactions   Naproxen     GI upset   Zoloft [Sertraline Hcl]     Social History   Socioeconomic History   Marital status: Married    Spouse name: Not on file   Number of children: 4   Years of education:  Not on file   Highest education level: Not on file  Occupational History   Occupation: homemaker  Tobacco Use   Smoking status: Never   Smokeless tobacco: Never  Vaping Use   Vaping status: Never Used  Substance and Sexual Activity   Alcohol use: No   Drug use: No   Sexual activity: Yes    Birth control/protection: None  Other Topics Concern   Not on file  Social History Narrative   Has 6 siblings; family now in [place]. Married homemaker, mother of 3 boys (twins, singleton) and adopted daughter from [place] 2021; husband is managing partner of law firm   Social Drivers of Research scientist (physical sciences) Strain: Not on file  Food Insecurity: Not on file  Transportation Needs: Not on file  Physical Activity: Not on file  Stress: Not on file  Social Connections: Not on file  Intimate Partner Violence: Not on file     Review of Systems: General: negative for chills, fever, night sweats or weight changes.  Cardiovascular: negative for chest pain, dyspnea on exertion, edema, orthopnea, palpitations, paroxysmal nocturnal dyspnea or shortness of breath Dermatological: negative for rash Respiratory: negative for cough or wheezing Urologic: negative for hematuria Abdominal: negative for nausea, vomiting, diarrhea, bright red blood per rectum, melena, or hematemesis Neurologic: negative for visual changes, syncope, or dizziness All other systems reviewed and are otherwise negative except as noted above.    Blood pressure 118/70, pulse 76, height 5\' 3"  (1.6 m), weight 152 lb (68.9 kg), SpO2 99%.  General appearance: alert and no distress Neck: no adenopathy, no carotid bruit, no JVD, supple, symmetrical, trachea midline, and thyroid not enlarged, symmetric, no tenderness/mass/nodules Lungs: clear to auscultation bilaterally Heart: regular rate and rhythm, S1, S2 normal, no murmur, click, rub or gallop Extremities: extremities normal, atraumatic, no cyanosis or edema Pulses: 2+ and symmetric Skin: Skin color, texture, turgor normal. No rashes or lesions Neurologic: Grossly normal  EKG EKG Interpretation Date/Time:  Friday June 30 2023 08:23:52 EDT Ventricular Rate:  76 PR Interval:  160 QRS Duration:  74 QT Interval:  390 QTC Calculation: 438 R Axis:   62  Text Interpretation: Normal sinus rhythm Normal ECG When compared with ECG of 23-Apr-2023 08:49, PREVIOUS ECG IS PRESENT Confirmed by Nanetta Batty [phone]) on 06/30/2023 8:25:19 AM    ASSESSMENT AND PLAN:   Atypical chest pain Patient had episode of atypical chest pain which brought her to the emergency room 04/23/2023  with a negative workup.  She thought it was related to anxiety versus excessive lifting.  She has had no recurrent symptoms.  Her only other risk factor is mild untreated hyperlipidemia.  I am going to check a coronary calcium score to her stratify.  Palpitations Occasional palpitations which cause her to occasionally take a deep breath.  She does drink occasional tea and soft drinks but does not drink coffee.  She does admit to anxiety.  Her sister has PVCs and PACs and her mother has PSVT.  I am going to get a 2-week Zio patch to further evaluate  Hyperlipidemia Recent lipid profile performed 07/14/2022 revealed total cholesterol 190, LDL 129 HDL 46.  She is not sure whether this was fasting.  She is going to have her PCP recheck.  I am going to get a coronary calcium score to her stratify     Runell Gess MD [facility], [facility] 06/30/2023 8:42 AM

## 2023-07-12 DIAGNOSIS — F4322 Adjustment disorder with anxiety: Secondary | ICD-10-CM | POA: Diagnosis not present

## 2023-07-18 DIAGNOSIS — F4322 Adjustment disorder with anxiety: Secondary | ICD-10-CM | POA: Diagnosis not present

## 2023-07-26 DIAGNOSIS — R002 Palpitations: Secondary | ICD-10-CM | POA: Diagnosis not present

## 2023-08-09 ENCOUNTER — Other Ambulatory Visit (HOSPITAL_COMMUNITY): Payer: Self-pay

## 2023-08-09 ENCOUNTER — Ambulatory Visit (HOSPITAL_BASED_OUTPATIENT_CLINIC_OR_DEPARTMENT_OTHER)
Admission: RE | Admit: 2023-08-09 | Discharge: 2023-08-09 | Disposition: A | Discharge status: Home / Self Care | Payer: Self-pay | Source: Ambulatory Visit | Attending: Cardiovascular Disease | Admitting: Cardiovascular Disease

## 2023-08-09 DIAGNOSIS — R0789 Other chest pain: Secondary | ICD-10-CM | POA: Insufficient documentation

## 2023-08-09 DIAGNOSIS — E782 Mixed hyperlipidemia: Secondary | ICD-10-CM | POA: Insufficient documentation

## 2023-08-09 DIAGNOSIS — R079 Chest pain, unspecified: Secondary | ICD-10-CM | POA: Insufficient documentation

## 2023-11-02 ENCOUNTER — Other Ambulatory Visit (HOSPITAL_COMMUNITY): Payer: Self-pay

## 2023-11-02 DIAGNOSIS — E559 Vitamin D deficiency, unspecified: Secondary | ICD-10-CM | POA: Diagnosis not present

## 2023-11-02 DIAGNOSIS — Z79899 Other long term (current) drug therapy: Secondary | ICD-10-CM | POA: Diagnosis not present

## 2023-11-02 DIAGNOSIS — E282 Polycystic ovarian syndrome: Secondary | ICD-10-CM | POA: Diagnosis not present

## 2023-11-02 DIAGNOSIS — R632 Polyphagia: Secondary | ICD-10-CM | POA: Diagnosis not present

## 2023-11-02 MED ORDER — METFORMIN HCL ER 500 MG PO TB24
2000.0000 mg | ORAL_TABLET | Freq: Every day | ORAL | 1 refills | Status: AC
  Filled 2023-11-02 – 2023-12-07 (×2): qty 360, 90d supply, fill #0
  Filled 2024-03-25: qty 360, 90d supply, fill #1

## 2023-11-06 ENCOUNTER — Other Ambulatory Visit (HOSPITAL_COMMUNITY): Payer: Self-pay

## 2023-11-06 MED ORDER — ACETAZOLAMIDE 125 MG PO TABS
125.0000 mg | ORAL_TABLET | Freq: Two times a day (BID) | ORAL | 1 refills | Status: AC
  Filled 2023-11-06: qty 28, 14d supply, fill #0

## 2023-12-07 ENCOUNTER — Other Ambulatory Visit (HOSPITAL_COMMUNITY): Payer: Self-pay

## 2023-12-14 ENCOUNTER — Other Ambulatory Visit (HOSPITAL_COMMUNITY): Payer: Self-pay

## 2024-01-17 ENCOUNTER — Other Ambulatory Visit (HOSPITAL_COMMUNITY): Payer: Self-pay

## 2024-01-17 DIAGNOSIS — R632 Polyphagia: Secondary | ICD-10-CM | POA: Diagnosis not present

## 2024-01-17 DIAGNOSIS — Z8639 Personal history of other endocrine, nutritional and metabolic disease: Secondary | ICD-10-CM | POA: Diagnosis not present

## 2024-01-17 DIAGNOSIS — E282 Polycystic ovarian syndrome: Secondary | ICD-10-CM | POA: Diagnosis not present

## 2024-01-17 DIAGNOSIS — E559 Vitamin D deficiency, unspecified: Secondary | ICD-10-CM | POA: Diagnosis not present

## 2024-01-17 MED ORDER — METFORMIN HCL ER 500 MG PO TB24
2000.0000 mg | ORAL_TABLET | Freq: Every day | ORAL | 1 refills | Status: AC
  Filled 2024-01-17: qty 360, 90d supply, fill #0

## 2024-03-25 ENCOUNTER — Other Ambulatory Visit (HOSPITAL_COMMUNITY): Payer: Self-pay

## 2024-05-02 ENCOUNTER — Other Ambulatory Visit (HOSPITAL_COMMUNITY): Payer: Self-pay

## 2024-05-02 MED ORDER — METFORMIN HCL ER 500 MG PO TB24
2000.0000 mg | ORAL_TABLET | Freq: Every day | ORAL | 1 refills | Status: AC
  Filled 2024-05-02: qty 360, 90d supply, fill #0

## 2024-05-03 ENCOUNTER — Other Ambulatory Visit (HOSPITAL_COMMUNITY): Payer: Self-pay
